# Patient Record
Sex: Female | Born: 1950
Health system: Southern US, Community
[De-identification: ages and names within clinical notes are randomized; demographics above are authoritative.]

## PROBLEM LIST (undated history)

## (undated) DIAGNOSIS — I10 Essential (primary) hypertension: Secondary | ICD-10-CM

## (undated) HISTORY — PX: ABDOMINAL HYSTERECTOMY: SHX81

---

## 1998-03-30 ENCOUNTER — Encounter: Admission: RE | Admit: 1998-03-30 | Discharge: 1998-03-30 | Payer: Self-pay | Admitting: Family Medicine

## 1998-04-04 ENCOUNTER — Encounter: Admission: RE | Admit: 1998-04-04 | Discharge: 1998-04-04 | Payer: Self-pay | Admitting: Family Medicine

## 1998-04-05 ENCOUNTER — Encounter: Admission: RE | Admit: 1998-04-05 | Discharge: 1998-04-05 | Payer: Self-pay | Admitting: Family Medicine

## 1998-04-06 ENCOUNTER — Encounter: Admission: RE | Admit: 1998-04-06 | Discharge: 1998-04-06 | Payer: Self-pay | Admitting: Family Medicine

## 1998-04-27 ENCOUNTER — Encounter: Admission: RE | Admit: 1998-04-27 | Discharge: 1998-04-27 | Payer: Self-pay | Admitting: Family Medicine

## 1998-05-05 ENCOUNTER — Encounter: Admission: RE | Admit: 1998-05-05 | Discharge: 1998-05-05 | Payer: Self-pay | Admitting: Family Medicine

## 1998-05-13 ENCOUNTER — Encounter: Admission: RE | Admit: 1998-05-13 | Discharge: 1998-05-13 | Payer: Self-pay | Admitting: Family Medicine

## 1999-01-02 ENCOUNTER — Encounter: Admission: RE | Admit: 1999-01-02 | Discharge: 1999-01-02 | Payer: Self-pay | Admitting: Family Medicine

## 1999-01-09 ENCOUNTER — Encounter: Admission: RE | Admit: 1999-01-09 | Discharge: 1999-01-09 | Payer: Self-pay | Admitting: Family Medicine

## 1999-01-24 ENCOUNTER — Encounter: Admission: RE | Admit: 1999-01-24 | Discharge: 1999-01-24 | Payer: Self-pay | Admitting: Family Medicine

## 1999-02-01 ENCOUNTER — Encounter: Admission: RE | Admit: 1999-02-01 | Discharge: 1999-02-01 | Payer: Self-pay | Admitting: Family Medicine

## 1999-02-22 ENCOUNTER — Encounter: Admission: RE | Admit: 1999-02-22 | Discharge: 1999-02-22 | Payer: Self-pay | Admitting: Family Medicine

## 1999-04-16 ENCOUNTER — Emergency Department (HOSPITAL_COMMUNITY): Admission: EM | Admit: 1999-04-16 | Discharge: 1999-04-16 | Payer: Self-pay | Admitting: *Deleted

## 1999-08-07 ENCOUNTER — Encounter: Admission: RE | Admit: 1999-08-07 | Discharge: 1999-08-07 | Payer: Self-pay | Admitting: Family Medicine

## 1999-09-20 ENCOUNTER — Encounter: Admission: RE | Admit: 1999-09-20 | Discharge: 1999-09-20 | Payer: Self-pay | Admitting: Family Medicine

## 2000-01-24 ENCOUNTER — Encounter: Admission: RE | Admit: 2000-01-24 | Discharge: 2000-01-24 | Payer: Self-pay | Admitting: Family Medicine

## 2000-02-01 ENCOUNTER — Encounter: Admission: RE | Admit: 2000-02-01 | Discharge: 2000-02-01 | Payer: Self-pay | Admitting: Family Medicine

## 2000-03-26 ENCOUNTER — Encounter: Admission: RE | Admit: 2000-03-26 | Discharge: 2000-03-26 | Payer: Self-pay | Admitting: Sports Medicine

## 2000-09-24 ENCOUNTER — Encounter: Admission: RE | Admit: 2000-09-24 | Discharge: 2000-09-24 | Payer: Self-pay | Admitting: Family Medicine

## 2001-03-10 ENCOUNTER — Emergency Department (HOSPITAL_COMMUNITY): Admission: EM | Admit: 2001-03-10 | Discharge: 2001-03-10 | Payer: Self-pay | Admitting: Emergency Medicine

## 2001-03-10 ENCOUNTER — Encounter: Payer: Self-pay | Admitting: Emergency Medicine

## 2001-04-04 ENCOUNTER — Encounter: Admission: RE | Admit: 2001-04-04 | Discharge: 2001-04-04 | Payer: Self-pay | Admitting: Family Medicine

## 2001-05-27 ENCOUNTER — Ambulatory Visit (HOSPITAL_COMMUNITY): Admission: RE | Admit: 2001-05-27 | Discharge: 2001-05-27 | Payer: Self-pay | Admitting: Sports Medicine

## 2001-09-12 ENCOUNTER — Encounter: Payer: Self-pay | Admitting: Sports Medicine

## 2001-09-12 ENCOUNTER — Encounter: Admission: RE | Admit: 2001-09-12 | Discharge: 2001-09-12 | Payer: Self-pay | Admitting: Sports Medicine

## 2001-12-03 ENCOUNTER — Encounter: Admission: RE | Admit: 2001-12-03 | Discharge: 2001-12-03 | Payer: Self-pay | Admitting: Family Medicine

## 2002-09-18 ENCOUNTER — Encounter: Admission: RE | Admit: 2002-09-18 | Discharge: 2002-09-18 | Payer: Self-pay | Admitting: *Deleted

## 2002-09-18 ENCOUNTER — Encounter: Payer: Self-pay | Admitting: Sports Medicine

## 2002-12-24 ENCOUNTER — Encounter: Payer: Self-pay | Admitting: Emergency Medicine

## 2002-12-24 ENCOUNTER — Inpatient Hospital Stay (HOSPITAL_COMMUNITY): Admission: EM | Admit: 2002-12-24 | Discharge: 2002-12-26 | Payer: Self-pay | Admitting: Emergency Medicine

## 2002-12-25 ENCOUNTER — Encounter: Payer: Self-pay | Admitting: Internal Medicine

## 2003-01-12 ENCOUNTER — Encounter: Admission: RE | Admit: 2003-01-12 | Discharge: 2003-01-12 | Payer: Self-pay | Admitting: Family Medicine

## 2003-02-04 ENCOUNTER — Ambulatory Visit (HOSPITAL_COMMUNITY): Admission: RE | Admit: 2003-02-04 | Discharge: 2003-02-04 | Payer: Self-pay | Admitting: Interventional Cardiology

## 2003-02-15 ENCOUNTER — Encounter: Admission: RE | Admit: 2003-02-15 | Discharge: 2003-02-15 | Payer: Self-pay | Admitting: Family Medicine

## 2003-02-26 ENCOUNTER — Encounter: Admission: RE | Admit: 2003-02-26 | Discharge: 2003-02-26 | Payer: Self-pay | Admitting: Family Medicine

## 2003-02-26 ENCOUNTER — Ambulatory Visit: Admission: RE | Admit: 2003-02-26 | Discharge: 2003-02-26 | Payer: Self-pay | Admitting: Family Medicine

## 2003-03-24 ENCOUNTER — Encounter: Admission: RE | Admit: 2003-03-24 | Discharge: 2003-03-24 | Payer: Self-pay | Admitting: Family Medicine

## 2003-04-07 ENCOUNTER — Encounter: Admission: RE | Admit: 2003-04-07 | Discharge: 2003-04-07 | Payer: Self-pay | Admitting: Family Medicine

## 2003-06-17 ENCOUNTER — Encounter: Admission: RE | Admit: 2003-06-17 | Discharge: 2003-06-17 | Payer: Self-pay | Admitting: Family Medicine

## 2003-10-15 ENCOUNTER — Encounter: Payer: Self-pay | Admitting: Sports Medicine

## 2003-10-15 ENCOUNTER — Encounter: Admission: RE | Admit: 2003-10-15 | Discharge: 2003-10-15 | Payer: Self-pay | Admitting: Sports Medicine

## 2004-01-03 ENCOUNTER — Encounter: Admission: RE | Admit: 2004-01-03 | Discharge: 2004-01-03 | Payer: Self-pay | Admitting: Family Medicine

## 2004-07-26 ENCOUNTER — Encounter: Admission: RE | Admit: 2004-07-26 | Discharge: 2004-07-26 | Payer: Self-pay | Admitting: Family Medicine

## 2004-07-30 ENCOUNTER — Emergency Department (HOSPITAL_COMMUNITY): Admission: EM | Admit: 2004-07-30 | Discharge: 2004-07-30 | Payer: Self-pay | Admitting: Emergency Medicine

## 2005-05-30 ENCOUNTER — Ambulatory Visit: Payer: Self-pay | Admitting: Family Medicine

## 2005-06-18 ENCOUNTER — Encounter: Admission: RE | Admit: 2005-06-18 | Discharge: 2005-06-18 | Payer: Self-pay | Admitting: Sports Medicine

## 2005-06-23 ENCOUNTER — Encounter (INDEPENDENT_AMBULATORY_CARE_PROVIDER_SITE_OTHER): Payer: Self-pay | Admitting: *Deleted

## 2005-07-12 ENCOUNTER — Ambulatory Visit: Payer: Self-pay | Admitting: Family Medicine

## 2006-01-02 ENCOUNTER — Ambulatory Visit: Payer: Self-pay | Admitting: Family Medicine

## 2006-01-09 ENCOUNTER — Ambulatory Visit: Payer: Self-pay | Admitting: Family Medicine

## 2006-02-13 ENCOUNTER — Emergency Department (HOSPITAL_COMMUNITY): Admission: EM | Admit: 2006-02-13 | Discharge: 2006-02-13 | Payer: Self-pay | Admitting: Emergency Medicine

## 2006-03-07 ENCOUNTER — Ambulatory Visit: Payer: Self-pay | Admitting: Family Medicine

## 2006-05-29 ENCOUNTER — Ambulatory Visit: Payer: Self-pay | Admitting: Family Medicine

## 2006-06-06 ENCOUNTER — Ambulatory Visit: Payer: Self-pay | Admitting: Family Medicine

## 2006-06-13 ENCOUNTER — Ambulatory Visit: Payer: Self-pay | Admitting: Family Medicine

## 2006-07-26 ENCOUNTER — Encounter: Admission: RE | Admit: 2006-07-26 | Discharge: 2006-07-26 | Payer: Self-pay | Admitting: Sports Medicine

## 2007-01-23 ENCOUNTER — Ambulatory Visit: Payer: Self-pay | Admitting: Family Medicine

## 2007-01-29 ENCOUNTER — Ambulatory Visit: Payer: Self-pay | Admitting: Family Medicine

## 2007-01-29 ENCOUNTER — Encounter (INDEPENDENT_AMBULATORY_CARE_PROVIDER_SITE_OTHER): Payer: Self-pay | Admitting: Family Medicine

## 2007-01-29 LAB — CONVERTED CEMR LAB
BUN: 15 mg/dL (ref 6–23)
CO2: 23 meq/L (ref 19–32)
Calcium: 9 mg/dL (ref 8.4–10.5)
Chloride: 108 meq/L (ref 96–112)
Cholesterol: 197 mg/dL (ref 0–200)
Creatinine, Ser: 0.68 mg/dL (ref 0.40–1.20)
Glucose, Bld: 91 mg/dL (ref 70–99)
HDL: 37 mg/dL — ABNORMAL LOW (ref >39)
Total CHOL/HDL Ratio: 5.3
Triglycerides: 50 mg/dL (ref <150)

## 2007-02-20 DIAGNOSIS — R079 Chest pain, unspecified: Secondary | ICD-10-CM | POA: Insufficient documentation

## 2007-02-20 DIAGNOSIS — I10 Essential (primary) hypertension: Secondary | ICD-10-CM | POA: Insufficient documentation

## 2007-02-20 DIAGNOSIS — H269 Unspecified cataract: Secondary | ICD-10-CM | POA: Insufficient documentation

## 2007-02-20 DIAGNOSIS — E876 Hypokalemia: Secondary | ICD-10-CM | POA: Insufficient documentation

## 2007-02-20 DIAGNOSIS — E7800 Pure hypercholesterolemia, unspecified: Secondary | ICD-10-CM | POA: Insufficient documentation

## 2007-02-20 DIAGNOSIS — E782 Mixed hyperlipidemia: Secondary | ICD-10-CM | POA: Insufficient documentation

## 2007-02-20 DIAGNOSIS — R12 Heartburn: Secondary | ICD-10-CM | POA: Insufficient documentation

## 2007-02-21 ENCOUNTER — Encounter (INDEPENDENT_AMBULATORY_CARE_PROVIDER_SITE_OTHER): Payer: Self-pay | Admitting: *Deleted

## 2007-08-29 ENCOUNTER — Encounter: Admission: RE | Admit: 2007-08-29 | Discharge: 2007-08-29 | Payer: Self-pay | Admitting: Sports Medicine

## 2007-09-01 ENCOUNTER — Encounter (INDEPENDENT_AMBULATORY_CARE_PROVIDER_SITE_OTHER): Payer: Self-pay | Admitting: Family Medicine

## 2007-11-28 ENCOUNTER — Telehealth (INDEPENDENT_AMBULATORY_CARE_PROVIDER_SITE_OTHER): Payer: Self-pay | Admitting: Family Medicine

## 2008-01-03 ENCOUNTER — Emergency Department (HOSPITAL_COMMUNITY): Admission: EM | Admit: 2008-01-03 | Discharge: 2008-01-03 | Payer: Self-pay | Admitting: Emergency Medicine

## 2008-01-14 ENCOUNTER — Telehealth: Payer: Self-pay | Admitting: *Deleted

## 2008-02-10 ENCOUNTER — Encounter: Payer: Self-pay | Admitting: *Deleted

## 2008-03-15 ENCOUNTER — Emergency Department (HOSPITAL_COMMUNITY): Admission: EM | Admit: 2008-03-15 | Discharge: 2008-03-15 | Payer: Self-pay | Admitting: Emergency Medicine

## 2008-03-15 ENCOUNTER — Telehealth: Payer: Self-pay | Admitting: *Deleted

## 2008-03-15 ENCOUNTER — Telehealth (INDEPENDENT_AMBULATORY_CARE_PROVIDER_SITE_OTHER): Payer: Self-pay | Admitting: *Deleted

## 2008-03-16 ENCOUNTER — Encounter: Payer: Self-pay | Admitting: Family Medicine

## 2008-03-16 ENCOUNTER — Ambulatory Visit: Payer: Self-pay | Admitting: Family Medicine

## 2008-03-16 DIAGNOSIS — J069 Acute upper respiratory infection, unspecified: Secondary | ICD-10-CM | POA: Insufficient documentation

## 2008-03-17 LAB — CONVERTED CEMR LAB
Cholesterol: 172 mg/dL (ref 0–200)
LDL Cholesterol: 121 mg/dL — ABNORMAL HIGH (ref 0–99)
Total CHOL/HDL Ratio: 7.5
VLDL: 28 mg/dL (ref 0–40)

## 2008-06-15 ENCOUNTER — Telehealth (INDEPENDENT_AMBULATORY_CARE_PROVIDER_SITE_OTHER): Payer: Self-pay | Admitting: *Deleted

## 2008-07-07 ENCOUNTER — Encounter: Payer: Self-pay | Admitting: *Deleted

## 2009-05-27 ENCOUNTER — Ambulatory Visit: Payer: Self-pay | Admitting: Family Medicine

## 2009-05-27 ENCOUNTER — Ambulatory Visit (HOSPITAL_COMMUNITY): Admission: RE | Admit: 2009-05-27 | Discharge: 2009-05-27 | Payer: Self-pay | Admitting: Family Medicine

## 2009-05-27 ENCOUNTER — Observation Stay (HOSPITAL_COMMUNITY): Admission: AD | Admit: 2009-05-27 | Discharge: 2009-05-28 | Payer: Self-pay | Admitting: Family Medicine

## 2009-05-27 DIAGNOSIS — I119 Hypertensive heart disease without heart failure: Secondary | ICD-10-CM | POA: Insufficient documentation

## 2009-05-27 DIAGNOSIS — I10 Essential (primary) hypertension: Secondary | ICD-10-CM | POA: Insufficient documentation

## 2009-05-30 ENCOUNTER — Ambulatory Visit: Payer: Self-pay | Admitting: Family Medicine

## 2009-05-30 ENCOUNTER — Encounter (INDEPENDENT_AMBULATORY_CARE_PROVIDER_SITE_OTHER): Payer: Self-pay | Admitting: Family Medicine

## 2009-05-30 DIAGNOSIS — I517 Cardiomegaly: Secondary | ICD-10-CM | POA: Insufficient documentation

## 2009-05-30 LAB — CONVERTED CEMR LAB
BUN: 26 mg/dL — ABNORMAL HIGH (ref 6–23)
Potassium: 3.6 meq/L (ref 3.5–5.3)
Sodium: 141 meq/L (ref 135–145)

## 2009-05-31 ENCOUNTER — Inpatient Hospital Stay (HOSPITAL_COMMUNITY): Admission: EM | Admit: 2009-05-31 | Discharge: 2009-06-02 | Payer: Self-pay | Admitting: Emergency Medicine

## 2009-05-31 ENCOUNTER — Encounter: Payer: Self-pay | Admitting: Family Medicine

## 2009-05-31 DIAGNOSIS — R42 Dizziness and giddiness: Secondary | ICD-10-CM | POA: Insufficient documentation

## 2009-06-01 ENCOUNTER — Encounter (INDEPENDENT_AMBULATORY_CARE_PROVIDER_SITE_OTHER): Payer: Self-pay | Admitting: Family Medicine

## 2009-06-16 ENCOUNTER — Ambulatory Visit: Payer: Self-pay | Admitting: Family Medicine

## 2009-06-16 ENCOUNTER — Encounter (INDEPENDENT_AMBULATORY_CARE_PROVIDER_SITE_OTHER): Payer: Self-pay | Admitting: Family Medicine

## 2009-06-17 ENCOUNTER — Encounter (INDEPENDENT_AMBULATORY_CARE_PROVIDER_SITE_OTHER): Payer: Self-pay | Admitting: Family Medicine

## 2009-06-17 LAB — CONVERTED CEMR LAB
Glucose, Bld: 98 mg/dL (ref 70–99)
Potassium: 3.3 meq/L — ABNORMAL LOW (ref 3.5–5.3)
Sodium: 140 meq/L (ref 135–145)

## 2009-06-24 ENCOUNTER — Encounter: Payer: Self-pay | Admitting: Family Medicine

## 2009-09-19 ENCOUNTER — Telehealth: Payer: Self-pay | Admitting: *Deleted

## 2010-10-03 ENCOUNTER — Telehealth: Payer: Self-pay | Admitting: Family Medicine

## 2010-10-04 ENCOUNTER — Ambulatory Visit: Payer: Self-pay | Admitting: Family Medicine

## 2010-10-05 ENCOUNTER — Encounter: Payer: Self-pay | Admitting: Family Medicine

## 2010-10-05 LAB — CONVERTED CEMR LAB
CO2: 27 meq/L (ref 19–32)
Calcium: 9.3 mg/dL (ref 8.4–10.5)
Glucose, Bld: 95 mg/dL (ref 70–99)
Sodium: 143 meq/L (ref 135–145)

## 2010-10-24 ENCOUNTER — Encounter: Payer: Self-pay | Admitting: Family Medicine

## 2010-10-24 ENCOUNTER — Ambulatory Visit: Payer: Self-pay | Admitting: Family Medicine

## 2010-10-24 LAB — CONVERTED CEMR LAB
AST: 17 units/L (ref 0–37)
Bilirubin, Direct: 0.1 mg/dL (ref 0.0–0.3)
Total Bilirubin: 0.7 mg/dL (ref 0.3–1.2)
Total CHOL/HDL Ratio: 4.9
VLDL: 10 mg/dL (ref 0–40)

## 2010-10-26 ENCOUNTER — Telehealth: Payer: Self-pay | Admitting: *Deleted

## 2010-10-31 ENCOUNTER — Telehealth: Payer: Self-pay | Admitting: *Deleted

## 2010-10-31 ENCOUNTER — Ambulatory Visit: Payer: Self-pay | Admitting: Family Medicine

## 2011-01-23 NOTE — Progress Notes (Signed)
Summary: informing pt of bp  Phone Note Outgoing Call   Call placed by: Jimmy Footman, CMA,  October 31, 2010 2:24 PM Call placed to: Insurer Summary of Call: LVM for pt to call back. Dr. Clotilde Dieter called and was unable to reach pt. Note is in chart concerning BP  Follow-up for Phone Call        called and lvm for pt to return call Follow-up by: Loralee Pacas CMA,  November 01, 2010 9:51 AM  Additional Follow-up for Phone Call Additional follow up Details #1::        LVM for pt to call back Additional Follow-up by: Jimmy Footman, CMA,  November 02, 2010 10:23 AM    Additional Follow-up for Phone Call Additional follow up Details #2::    called and lvm for pt  Follow-up by: Loralee Pacas CMA,  November 02, 2010 4:40 PM  Additional Follow-up for Phone Call Additional follow up Details #3:: Details for Additional Follow-up Action Taken: called and left vm for pt to call back Additional Follow-up by: Jimmy Footman, CMA,  November 03, 2010 2:31 PM   Lvm for patient to call back.Jimmy Footman, CMA  November 06, 2010 11:39 AM   Spoke with pat.Jimmy Footman, CMA  November 06, 2010 4:10 PM

## 2011-01-23 NOTE — Progress Notes (Signed)
Summary: refill  Phone Note Refill Request Call back at Home Phone [phone] Message from:  Patient  Refills Requested: Medication #1:  LISINOPRIL-HYDROCHLOROTHIAZIDE 20-12.5 MG TABS 1 by mouth daily has appt on 11/1 - needs enough until appt  Initial call taken by: De Nurse,  October 03, 2010 8:33 AM  Follow-up for Phone Call        Pt needs to have potassium checked before we can refill this medicine again.  Could she come in for a lab visit (does not need to be fasting - order is already in the computer) and then we can refill the medicine?   Follow-up by: Alliya Marcon [place] MD,  October 03, 2010 9:35 AM  Additional Follow-up for Phone Call Additional follow up Details #1::        LVM for pt to come to labs Additional Follow-up by: De Nurse,  October 04, 2010 9:27 AM    Additional Follow-up for Phone Call Additional follow up Details #2::    Pt returned call and is coming in today for labs. Follow-up by: Clydell Hakim,  October 04, 2010 12:06 PM

## 2011-01-23 NOTE — Assessment & Plan Note (Signed)
Summary: bp check/eo  Nurse Visit  Patient in for BP check today. BP checked manually after resting 10 minutes with regular adult cuff. BP LA 160/92 RA 150/ 94. pulse 68. patient reports she is taking meds as directed.  states she feels well. will forward to MD. Theresia Lo RN  October 31, 2010 11:44 AM   That BP is much better than her 200's/100's that she had during her last appointment. It is still not ideal though. I tried to call the patient and ask her to go up on the Norvasc to hopefully get just a little better control, but I got a weird noise on her answering machine. I will have white team continue to try to get a hold of her. I will send in a new Rx with the higher dose but for now she can just take 2 of the Norvasc until they are gone. Thanks. Jamie Brookes MD  October 31, 2010 12:07 PM   Allergies: No Known Drug Allergies  Orders Added: 1)  No Charge Patient Arrived (NCPA0) [NCPA0] Prescriptions: NORVASC 10 MG TABS (AMLODIPINE BESYLATE) take 1 pill daily for high BP  #30 x 3   Entered and Authorized by:   Jamie Brookes MD   Signed by:   Jamie Brookes MD on 10/31/2010   Method used:   [facility]        [facility] [address] [phone]* (retail)       [address]       [place], [place]  [address]       Ph: [phone] or [phone]       Fax: [phone]   RxID:   [id number]   Appended Document: bp check/eo patient left her home number and her work number . work # [phone] and home [phone]. I also tried calling and home phone is not working . left message on work phone number to call back.  Appended Document: bp check/eo patient notified advised to recheck BP in 2 weeks.

## 2011-01-23 NOTE — Assessment & Plan Note (Signed)
Summary: HTN, HLD, feet/ankle hurt   Vital Signs:  Patient profile:   60 year old female Weight:      177.3 pounds Temp:     98.2 degrees F oral Pulse rate:   63 / minute Pulse rhythm:   regular BP sitting:   211 / 100  (right arm) Cuff size:   regular  Vitals Entered By: Loralee Pacas CMA (October 24, 2010 9:04 AM) CC: HTN meds refilled Is Patient Diabetic? No   Primary Care Provider:  . [facility]  CC:  HTN meds refilled.  History of Present Illness: hypertension: Pt comes in today with very elevated BP (200/100). She has been out of some of her meds, she says she is usually in the 160's/90's. She has been out of the Norvasc for a while but she just ran out of her other BP meds. She sees Dr. Garnette Scheuermann [facility]) and says she is going to see him soon about the weakness and tiredness she feels shen walking up the hill. She is not having any chest pain. She had a cardiac cath in the past that was normal (told she has no blockages). No blurry vision or other side effects complained of today. '  HLd:  Pt has also run out of simvastatin and is suppose to be on this med.   Foot and Ankle pain when walking. no injury, wants to know if she should get new shoes.   Habits & Providers  Alcohol-Tobacco-Diet     Tobacco Status: quit  Exercise-Depression-Behavior     Does Patient Exercise: yes     Exercise Counseling: to improve exercise regimen     Type of exercise: walking     Exercise (avg: min/session): <30     Times/week: 5     Have you felt down or hopeless? no     Have you felt little pleasure in things? no     Depression Counseling: not indicated; screening negative for depression     Seat Belt Use: always  Current Medications (verified): 1)  Aspirin Ec 81 Mg Tbec (Aspirin) .... Take 1 Tablet By Mouth Once A Day 2)  Lisinopril-Hydrochlorothiazide 20-12.5 Mg Tabs (Lisinopril-Hydrochlorothiazide) .Marland Kitchen.. 1 By Mouth Daily 3)  Norvasc 5 Mg Tabs (Amlodipine Besylate) .Marland Kitchen..  1 By Mouth Daily 4)  Simvastatin 40 Mg Tabs (Simvastatin) .... Take On Tablet Qhs 5)  Nitroglycerin 0.4 Mg Subl (Nitroglycerin) .Marland Kitchen.. 1 Under Tongue Every 5 Min X 3 Doses As Needed For Chest Pain, Seek Help If You Need The Third Dose 6)  Potassium Chloride Crys Cr 20 Meq Cr-Tabs (Potassium Chloride Crys Cr) .Marland Kitchen.. 1 By Mouth Daily X 3 Days  Allergies (verified): No Known Drug Allergies  Social History: Does Patient Exercise:  yes Seat Belt Use:  always  Review of Systems        vitals reviewed and pertinent negatives and positives seen in HPI   Physical Exam  General:  Well-developed,well-nourished,in no acute distress; alert,appropriate and cooperative throughout examination Lungs:  Normal respiratory effort, chest expands symmetrically. Lungs are clear to auscultation, no crackles or wheezes. Heart:  Normal rate and regular rhythm. S1 and S2 normal without gallop, murmur, click, rub or other extra sounds. Extremities:  No clubbing, cyanosis, edema, or deformity noted with normal full range of motion of all joints.     Impression & Recommendations:  Problem # 1:  HYPERTENSION, BENIGN SYSTEMIC (ICD-401.1) Assessment Deteriorated Pt had her BP meds refilled today. RTC in 1 week for  RN BP check.   Her updated medication list for this problem includes:    Lisinopril-hydrochlorothiazide 20-12.5 Mg Tabs (Lisinopril-hydrochlorothiazide) .Marland Kitchen... 1 by mouth daily    Norvasc 5 Mg Tabs (Amlodipine besylate) .Marland Kitchen... 1 by mouth daily  Orders: [facility]- Est Level  3 (69629)  Problem # 2:  HYPERCHOLESTEROLEMIA (ICD-272.0) Assessment: Unchanged Pt restarted on Simvastatin. Labs checked today.   Her updated medication list for this problem includes:    Simvastatin 40 Mg Tabs (Simvastatin) .Marland Kitchen... Take on tablet qhs  Orders: Hepatic-[facility] ([address]41-[address]40) Lipid-[facility] ([address]64) [facility]- Est Level  3 ([address]) Pt concerned about getting new shoes, suggested getting new shoes and putting good supportive  inserts in them. See insturctions  Complete Medication List: 1)  Aspirin Ec 81 Mg Tbec (Aspirin) .... Take 1 tablet by mouth once a day 2)  Lisinopril-hydrochlorothiazide 20-12.5 Mg Tabs (Lisinopril-hydrochlorothiazide) .Marland Kitchen.. 1 by mouth daily 3)  Norvasc 5 Mg Tabs (Amlodipine besylate) .Marland Kitchen.. 1 by mouth daily 4)  Simvastatin 40 Mg Tabs (Simvastatin) .... Take on tablet qhs 5)  Nitroglycerin 0.4 Mg Subl (Nitroglycerin) .Marland Kitchen.. 1 under tongue every 5 min x 3 doses as needed for chest pain, seek help if you need the third dose 6)  Potassium Chloride Crys Cr 20 Meq Cr-tabs (Potassium chloride crys cr) .Marland Kitchen.. 1 by mouth daily x 3 days  Patient Instructions: 1)  Good job with your exercising.  2)  If you get new shoes, consider getting Super Feet inserts (find them at [facility] in [facility] near [place]).  3)  We are getting liver function tests and fasting lipid tests.  4)  We refilled your BP meds.  5)  COme in next week, for a RN visit, just to check BP to make sure it's going down.  Prescriptions: POTASSIUM CHLORIDE CRYS CR 20 MEQ CR-TABS (POTASSIUM CHLORIDE CRYS CR) 1 by mouth daily x 3 days  #31 x 5   Entered by:   Loralee Pacas CMA   Authorized by:   Jamie Brookes MD   Signed by:   Loralee Pacas CMA on 10/24/2010   Method used:   [facility]        [facility] [address] [phone]* (retail)       [address]       [place], [place]  [address]       Ph: [phone] or [phone]       Fax: [phone]   RxID:   [id number] SIMVASTATIN 40 MG TABS (SIMVASTATIN) Take on tablet QHS  #31 x 5   Entered by:   Loralee Pacas CMA   Authorized by:   Jamie Brookes MD   Signed by:   Loralee Pacas CMA on 10/24/2010   Method used:   Electronically to        [facility] [address] [phone]* (retail)       [address]       [place], [place]  [address]       Ph: [phone] or [phone]       Fax: [phone]   RxID:    [id number] NORVASC 5 MG TABS (AMLODIPINE BESYLATE) 1 by mouth daily  #31 x 5   Entered by:   Loralee Pacas CMA   Authorized by:   Jamie Brookes MD   Signed by:   Loralee Pacas CMA on 10/24/2010   Method used:   Electronically to  [facility] [address] [phone]* (retail)       [address]       [place], [place]  [address]       Ph: [phone] or [phone]       Fax: [phone]   RxID:   [id number] LISINOPRIL-HYDROCHLOROTHIAZIDE 20-12.5 MG TABS (LISINOPRIL-HYDROCHLOROTHIAZIDE) 1 by mouth daily  #31 x 5   Entered by:   Loralee Pacas CMA   Authorized by:   Jamie Brookes MD   Signed by:   Loralee Pacas CMA on 10/24/2010   Method used:   Electronically to        [facility] [address] [phone]* (retail)       [address]       [place], [place]  [address]       Ph: [phone] or [phone]       Fax: [phone]   RxID:   [id number]    Orders Added: 1)  Hepatic-[facility] [80076-22960] 2)  Lipid-[facility] [80061-22930] 3)  [facility]- Est Level  3 [[address]]

## 2011-01-23 NOTE — Progress Notes (Signed)
  Phone Note Call from Patient   Caller: Patient Call For: [phone] or [phone] Summary of Call: Pt was returning a call she received yesterday.  She is presently at work.  So if you want to call her back call numbers above and if you are unable to reach me lv msg at that latter . Initial call taken by: Abundio Miu,  October 26, 2010 9:42 AM

## 2011-02-28 ENCOUNTER — Emergency Department (HOSPITAL_COMMUNITY)
Admission: EM | Admit: 2011-02-28 | Discharge: 2011-02-28 | Disposition: A | Discharge status: Home / Self Care | Payer: BC Managed Care – PPO | Attending: Emergency Medicine | Admitting: Emergency Medicine

## 2011-02-28 ENCOUNTER — Inpatient Hospital Stay (INDEPENDENT_AMBULATORY_CARE_PROVIDER_SITE_OTHER)
Admission: RE | Admit: 2011-02-28 | Discharge: 2011-02-28 | Disposition: A | Discharge status: Home / Self Care | Payer: BC Managed Care – PPO | Source: Ambulatory Visit | Attending: Family Medicine | Admitting: Family Medicine

## 2011-02-28 DIAGNOSIS — I10 Essential (primary) hypertension: Secondary | ICD-10-CM | POA: Insufficient documentation

## 2011-02-28 DIAGNOSIS — R51 Headache: Secondary | ICD-10-CM | POA: Insufficient documentation

## 2011-02-28 DIAGNOSIS — R42 Dizziness and giddiness: Secondary | ICD-10-CM | POA: Insufficient documentation

## 2011-04-02 LAB — CBC
HCT: 38.8 % (ref 36.0–46.0)
HCT: 42.3 % (ref 36.0–46.0)
Hemoglobin: 14.5 g/dL (ref 12.0–15.0)
MCHC: 34.3 g/dL (ref 30.0–36.0)
MCV: 89.3 fL (ref 78.0–100.0)
MCV: 89.8 fL (ref 78.0–100.0)
Platelets: 199 10*3/uL (ref 150–400)
Platelets: 210 10*3/uL (ref 150–400)
RBC: 4.73 MIL/uL (ref 3.87–5.11)
RDW: 14.6 % (ref 11.5–15.5)
WBC: 5.3 10*3/uL (ref 4.0–10.5)

## 2011-04-02 LAB — CARDIAC PANEL(CRET KIN+CKTOT+MB+TROPI)
CK, MB: 1 ng/mL (ref 0.3–4.0)
CK, MB: 1.1 ng/mL (ref 0.3–4.0)
CK, MB: 1.5 ng/mL (ref 0.3–4.0)
CK, MB: 1.6 ng/mL (ref 0.3–4.0)
Relative Index: 1.4 (ref 0.0–2.5)
Relative Index: INVALID (ref 0.0–2.5)
Relative Index: INVALID (ref 0.0–2.5)
Total CK: 104 U/L (ref 7–177)
Total CK: 68 U/L (ref 7–177)
Total CK: 75 U/L (ref 7–177)
Troponin I: 0.02 ng/mL (ref 0.00–0.06)

## 2011-04-02 LAB — APTT: aPTT: 30 seconds (ref 24–37)

## 2011-04-02 LAB — PROTIME-INR
INR: 1.1 (ref 0.00–1.49)
Prothrombin Time: 14.5 s (ref 11.6–15.2)

## 2011-04-02 LAB — BASIC METABOLIC PANEL
BUN: 17 mg/dL (ref 6–23)
BUN: 20 mg/dL (ref 6–23)
BUN: 24 mg/dL — ABNORMAL HIGH (ref 6–23)
CO2: 29 mEq/L (ref 19–32)
CO2: 30 mEq/L (ref 19–32)
Chloride: 101 mEq/L (ref 96–112)
Chloride: 103 mEq/L (ref 96–112)
Chloride: 104 mEq/L (ref 96–112)
Chloride: 104 mEq/L (ref 96–112)
Chloride: 106 mEq/L (ref 96–112)
Creatinine, Ser: 0.76 mg/dL (ref 0.4–1.2)
GFR calc Af Amer: >60 mL/min (ref >60)
GFR calc Af Amer: >60 mL/min (ref >60)
GFR calc non Af Amer: >60 mL/min (ref >60)
GFR calc non Af Amer: >60 mL/min (ref >60)
GFR calc non Af Amer: >60 mL/min (ref >60)
Glucose, Bld: 109 mg/dL — ABNORMAL HIGH (ref 70–99)
Glucose, Bld: 123 mg/dL — ABNORMAL HIGH (ref 70–99)
Potassium: 3 mEq/L — ABNORMAL LOW (ref 3.5–5.1)
Potassium: 3.3 mEq/L — ABNORMAL LOW (ref 3.5–5.1)
Potassium: 3.3 mEq/L — ABNORMAL LOW (ref 3.5–5.1)
Potassium: 3.3 mEq/L — ABNORMAL LOW (ref 3.5–5.1)
Potassium: 3.8 mEq/L (ref 3.5–5.1)
Sodium: 139 mEq/L (ref 135–145)
Sodium: 139 mEq/L (ref 135–145)
Sodium: 140 mEq/L (ref 135–145)
Sodium: 140 mEq/L (ref 135–145)

## 2011-04-02 LAB — HEMOGLOBIN A1C
Hgb A1c MFr Bld: 5.2 % (ref 4.6–6.1)
Mean Plasma Glucose: 103 mg/dL

## 2011-04-02 LAB — LIPID PANEL
HDL: 37 mg/dL — ABNORMAL LOW
Total CHOL/HDL Ratio: 6.1 ratio
Triglycerides: 54 mg/dL
VLDL: 11 mg/dL (ref 0–40)

## 2011-04-02 LAB — URINALYSIS, ROUTINE W REFLEX MICROSCOPIC
Bilirubin Urine: NEGATIVE
Ketones, ur: NEGATIVE mg/dL
Nitrite: NEGATIVE
Urobilinogen, UA: 0.2 mg/dL (ref 0.0–1.0)

## 2011-04-02 LAB — DIFFERENTIAL
Basophils Absolute: 0 K/uL (ref 0.0–0.1)
Basophils Relative: 1 % (ref 0–1)
Eosinophils Absolute: 0.1 K/uL (ref 0.0–0.7)
Eosinophils Relative: 2 % (ref 0–5)
Lymphocytes Relative: 32 % (ref 12–46)
Lymphs Abs: 2.1 K/uL (ref 0.7–4.0)
Monocytes Absolute: 0.7 K/uL (ref 0.1–1.0)
Monocytes Relative: 10 % (ref 3–12)
Neutro Abs: 3.7 K/uL (ref 1.7–7.7)
Neutrophils Relative %: 56 % (ref 43–77)

## 2011-04-02 LAB — COMPREHENSIVE METABOLIC PANEL
ALT: 17 U/L (ref 0–35)
Chloride: 108 mEq/L (ref 96–112)
GFR calc Af Amer: >60 mL/min (ref >60)
GFR calc non Af Amer: >60 mL/min (ref >60)
Potassium: 3.5 mEq/L (ref 3.5–5.1)

## 2011-04-02 LAB — CK TOTAL AND CKMB (NOT AT ARMC)
CK, MB: 1.7 ng/mL (ref 0.3–4.0)
Relative Index: INVALID (ref 0.0–2.5)

## 2011-04-02 LAB — TROPONIN I: Troponin I: 0.03 ng/mL (ref 0.00–0.06)

## 2011-04-02 LAB — POCT CARDIAC MARKERS: Troponin i, poc: <0.05 ng/mL (ref 0.00–0.09)

## 2011-05-08 NOTE — Consult Note (Signed)
[name], [name]            ACCOUNT NO.:  [id number]   MEDICAL RECORD NO.:  [id number]          PATIENT TYPE:  INP   LOCATION:  6522                         FACILITY:  [facility]   PHYSICIAN:  Lyn Records, M.D.   DATE OF BIRTH:  01-25-1951   DATE OF CONSULTATION:  06/01/2009  DATE OF DISCHARGE:  06/02/2009                                 CONSULTATION   REASON FOR CONSULTATION:  Chest discomfort.   CONCLUSIONS:  1. Atypical chest pain that is unlikely to represent ischemic pain.      This does not represent an acute coronary syndrome.  2. Poorly controlled hypertension which is likely the source of the      patient's exertional dyspnea and chest tightness.  3. Hyperlipidemia.  4. Moderate obesity.  5. Recent vertigo of uncertain cause.   RECOMMENDATIONS:  1. Agree with aggressive blood pressure control.  2. Orthostasis is likely secondary to over diuresis.  Would recommend      decreasing hydrochlorothiazide to 12.5 mg per day and increasing      angiotensin-converting enzyme inhibitor therapy, perhaps using a      combination tablets such as lisinopril HCT 20/12.5 mg per day.  3. An outpatient nuclear myocardial perfusion study with exercise      stress should be done after the blood pressure is controlled.  The      study will help to reassess LV function and to document the absence      of myocardial ischemia.  It will also give [place] some idea of her      functional capacity.  4. I believe she can be discharged and this be safely done as an      outpatient.   COMMENTS:  The patient is a pleasant 60 year old African American female  with a history of hypertension, hyperlipidemia, and premature  atherosclerosis in her family history.  She underwent an extensive  cardiac evaluation in 2004 which included a coronary arteriogram that  was negative for evidence of CAD, although she did have minimal luminal  irregularities in the right coronary.  LV function and LV  end-diastolic  pressures at that time were normal.   Recently, the patient has been under emotional stress.  Several  relatives have died.  Last week, she was admitted from the [facility] because of extremely elevated blood pressures in the  greater than 200/110 range.  Her medications were titrated upward during  the hospital stay, and she was discharged home after an overnight  admission.  On the day of admission on this occasion, the concern was  the sudden development of dizziness that was characterized with  qualities of vertigo, chest tightness, and weakness upon standing.  Upon  admission to the hospital, there was a 40-point drop in blood pressure  when going from lying to standing.  This blood pressure change was  157/70 to 120/78.   Since admission to the hospital, cardiac markers are negative for  ischemia.  EKG reveals inferior Q-waves inversions.  EKG changes are  felt to be compatible with probable left ventricular hypertrophy.   The patient's  current medical regimen is:  1. Aspirin 81 mg per day.  2. K-Dur 20 mEq per day.  3. Norvasc 10 mg per day.  4. Prinivil 5 mg per day.  5. Zocor 40 mg per day.  Currently, hydrochlorothiazide is on hold.   SIGNIFICANT MEDICAL PROBLEMS:  Outlined above.   The patient denies tobacco use and does not drink alcohol.   PHYSICAL EXAMINATION:  GENERAL:  The patient is lying comfortably in  bed.  VITAL SIGNS:  She has a blood pressure of 130/60, heart rate is 60, and  temperature is 98.1.  NECK:  No JVD is noted.  No carotid bruits are heard.  LUNGS:  Clear.  CARDIAC:  Reveals an S4.  ABDOMEN:  Soft.  EXTREMITIES:  No edema.   LABORATORY DATA:  Negative markers for ischemia x3.  The BNP level is  less than 30.  The glycosylated hemoglobin is 5.2.  BUN and creatinine  are 28 and 0.91 on admission.  Potassium was 3.0.  Hemoglobin is 12.8.   The electrocardiograms revealed prominent voltage and T-wave abnormality   that I believe is secondary to LVH.  The chest x-ray on admission  reveals cardiomegaly, but otherwise unremarkable.   DISCUSSION:  I believe the patient likely has hypertensive heart disease  and poor control of her blood pressures.  I would suspect that she has  diastolic dysfunction.  The diuretic dose of 25 mg of HCTZ may have been  somewhat aggressive for her.  She will need to have a diuretic in her  regimen, but I will recommend only 12.5 mg per day and include this in a  combination tablet with an ACE inhibitor such as lisinopril HCT 20/12.5  mg per day.  She will need to have an outpatient myocardial perfusion  study with stress to assess LV function, rule out ischemia, and evaluate  her blood pressure control.      Lyn Records, M.D.  Electronically Signed     HWS/MEDQ  D:  06/02/2009  T:  06/03/2009  Job:  [id number]   cc:   Dr. Elvera Lennox, MD  Ruthe Mannan, M.D.

## 2011-05-08 NOTE — Discharge Summary (Signed)
[name], DODDRIDGE            ACCOUNT NO.:  [id number]   MEDICAL RECORD NO.:  [id number]          PATIENT TYPE:  INP   LOCATION:  6522                         FACILITY:  [facility]   PHYSICIAN:  Leighton Roach McDiarmid, M.D.DATE OF BIRTH:  1951/02/19   DATE OF ADMISSION:  05/31/2009  DATE OF DISCHARGE:  06/02/2009                               DISCHARGE SUMMARY   PRIMARY CARE [name]:  Levander Campion, MD, at [facility].   DISCHARGE DIAGNOSES:  1. Chest pain.  2. Dizziness.  3. Hypokalemia, resolved.  4. Hypertension.  5. Hyperlipidemia.  6. Obesity.   DISCHARGE MEDICATIONS:  1. Aspirin 81 mg 1 tablet daily.  2. Norvasc 5 mg 1 tablet daily.  3. Lisinopril/hydrochlorothiazide 20/12.5 one tablet daily.  4. Simvastatin 40 mg 1 tablet each night.  5. Nitroglycerin 0.3 sublingual take 1 tablet when needed for chest      pain, may repeat every 5 minutes for a total of 3 doses.  If she      still continues to have chest pain, return to the ED.   DISCONTINUED MEDICATIONS:  Hydrochlorothiazide, K-Dur.   CONSULT:  Lyn Records, MD, Cardiology.   PROCEDURE:  Two-view chest x-ray on June 8 shows minimal cardiac  enlargement.  Tortuous aorta.  No acute abnormalities.   LABORATORY DATA:  1. Cardiac enzymes negative x3.  2. BNP less than 30.  3. Urinalysis; specific gravity 1.013, trace blood, negative ketones,      negative nitrite, moderate leukocytes.  Urine micro shows rare      squamous epithelial cells, 3-6 white blood cells, many bacteria.  4. Basic metabolic panel:  On admission, potassium was noted to be 3.0      with creatinine of 0.91 and glucose 129.  On discharge, glucose was      98, creatinine 0.88, and potassium had corrected to 3.8.   BRIEF HOSPITAL COURSE:  This is a 60 year old African American female  who presented to the ED after having an episode of chest tightness and  lightheadedness that had resolved by time of admission.  Please see  dictated  admission H&P for full details.  1. Chest pain.  The patient presented with atypical chest pain, but      given her significant risk factors and family history, cardiac      enzymes cycled and a repeat EKG was obtained.  Her cardiologist was      consulted for consideration of inpatient versus outpatient stress      testing.  Cardiac enzymes and EKG were normal.  The patient has a      history of bradycardia, but tolerated the low-dose beta-blocker      while she was being ruled out for MI.  This was discontinued on      discharge.  The patient will follow up with Dr. Katrinka Blazing for stress      testing and echo.  Of note, the patient's EKG at this admission and      previously had shown T-wave in leads I through III and V4 through      V6 and on  this admission had shown a T-wave in V1.  2. Dizziness.  The patient's dizziness was questionably orthostatic by      history versus bradycardia.  The patient had resolved by time of      admission.  She noted one additional episode of dizziness upon      walking to the bathroom first thing in the morning.  Orthostatics      did show the patient's blood pressure decreased upon standing.  The      patient was not symptomatic at this time.  On telemetry, the      patient's bradycardia down into the 40s and 50s were also      asymptomatic.  These episodes are sporadic and the patient's      history not consistent with any of obvious causes at this time.  3. Hypertension.  The patient's blood pressure was elevated on      presentation to the ED with a systolic blood pressure in the 180s,      which then decreased to systolic in the 160s on admission.  The      patient's blood pressures continued to be elevated in the 150s and      160s throughout hospitalization given considering her bradycardia      and hypokalemia, her cardiologist felt it would be best to change      her medical regimen to Norvasc 5 mg daily and      lisinopril/hydrochlorothiazide combo  20/25.  The patient was asked      to stop taking hydrochlorothiazide.  4. Hyperlipidemia.  The patient was continued on her and sent home on      her home dose of simvastatin 40 mg.  5. Hypokalemia.  The patient has been hypokalemic before likely      secondary to hydrochlorothiazide.  The patient was repleted with      potassium 3.8 on discharge.  Given that the patient's      hydrochlorothiazide dose is being decreased and with the addition      of an ACE inhibitor, the patient was asked to discontinue her      potassium supplement and to follow up with PCP for repeat potassium      check.   FOLLOWUP APPOINTMENTS:  The patient will follow up with Dr. Katrinka Blazing to  schedule an outpatient echo and stress testing.  The patient will  contact her for appointment.  The patient to follow up with Dr. Luz Brazen  at [facility] in 1-2 weeks.      Delbert Harness, MD  Electronically Signed      Leighton Roach McDiarmid, M.D.  Electronically Signed    KB/MEDQ  D:  06/03/2009  T:  06/04/2009  Job:  [id number]   cc:   Levander Campion, M.D.  Lyn Records, M.D.

## 2011-05-08 NOTE — H&P (Signed)
[name], [name]:  [id number]   MEDICAL RECORD NO.:  [id number]          PATIENT TYPE:  OBV   LOCATION:  6522                         FACILITY:  [facility]   PHYSICIAN:  Pearlean Brownie, M.D.DATE OF BIRTH:  08/28/1951   DATE OF ADMISSION:  05/31/2009  DATE OF DISCHARGE:                              HISTORY & PHYSICAL   PRIMARY CARE [name]:  Laurie Campion, MD at [facility].   HISTORY OF PRESENT ILLNESS:  This is a 60 year old African American  female who presented to the ED after having an episode of chest  tightness and lightheadedness.   She states that she woke up at 1 a.m. and upon walking to the bathroom  felt lightheaded and had to sit down.  She also felt like her heart was  in a panic and felt tight chested.  The episode lasted about 15  minutes and she states she felt much better after sitting down.  By the  time new episode arrived, this episode had resolved.  Not associated  with shortness of breath, diaphoresis, or nausea.  The patient states  she had no pain with this episodes.  Prior to going to bed, she had a  headache, but was otherwise in her usual state of health.   The patient was recently admitted to the hospital on May 27, 2009, from  clinic for hypertensive urgency and an EKG which showed T-waves in leads  I through III and V4 through V6.  She denies any chest pain at this  time.  Throughout this hospitalization, there were no further EKG  changes and her cardiac enzymes were normal.  Upon discharge, the  patient was scheduled for an outpatient echo on June 02, 2009.   PAST MEDICAL HISTORY:  1. Hypertension.  2. Hyperlipidemia.  3. Obesity.  4. History of hypokalemia.   MEDICATIONS:  1. Aspirin 81 mg.  2. Hydrochlorothiazide 25 mg daily.  3. Norvasc 10 mg daily.  4. Simvastatin 40 mg daily.   ALLERGIES:  No known drug allergies.   FAMILY HISTORY:  The patient has a brother who had severe  hypertension  and a recent hemorrhagic CVA.  This brother also has diabetes.  The  patient's sister had hypertension, CVA, and an MI and died at age 57.  Her mother had hypertension and an MI and died at age 67.  Her father  had hypertension.  She has another sister who had stomach and liver  cancer.  She has niece and nephews with diabetes.   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  She has a history of tobacco abuse, but quit 20  years ago.  The patient does not drink alcohol or use illicit drugs.   REVIEW OF SYSTEMS:  Review of systems was negative except for as stated  in the HPI.   PHYSICAL EXAMINATION:  VITAL SIGNS:  Temperature 97.8 degrees, pulse 70,  respirations 20 per minute, blood pressure 164/85, and O2 96% on room  air.  GENERAL:  Pleasant obese female who is alert  and oriented x3 in no acute  distress.  HEENT:  Head normocephalic and atraumatic.  NECK:  Supple with no JVD or bruits.  LUNGS:  Normal respiratory effort.  Lungs are clear to auscultation.  HEART:  Regular rate and rhythm, 2/6 systolic murmur.  ABDOMEN:  Bowel sounds positive, soft, nontender, no distention.  EXTREMITIES:  No clubbing, cyanosis, edema noted.   LABORATORY DATA:  1. CBC:  White blood count 6.7, hemoglobin 14.5, hematocrit 42.3,      platelets 208.  2. BMET:  Sodium 139, potassium 3, chloride 101, bicarbonate 29,      glucose 129, BUN 28, creatinine 0.91, calcium 9.7.  3. INR 1.1.  4. BNP less than 30.  5. Urinalysis shows specific gravity of 1.103, glucose negative,      hemoglobin trace, ketones negative, nitrites negative, leukocyte      esterase moderate.  Micro shows rare epis, 3-6 white blood cells,      many bacteria.  6. Point-of-care cardiac markers negative x1 in the ED.   EKG shows normal sinus rhythm at a rate of 82.  T-waves noted in V1.  No  ST elevation or depression.  No Q-waves present.   Chest x-ray showed minimal  cardiac margin.  No acute abnormalities.   ASSESSMENT AND PLAN:  This is a 60 year old female who presented to the  ED with lightheadedness and chest tightness.  The patient's chest  tightness is atypical in nature, but given her significant risk factors  and family history, we will cycle cardiac enzymes and repeat EKG.  The  patient would be placed on aspirin and a low-dose beta-blocker with  caution given that the patient has a history of bradycardia.  The  patient's heart rate is normal at this time.  We will discuss the  continued need for an inpatient stress testing versus outpatient workup.  The patient will be likely discharged on time for followup echo on June 02, 2009.  1. Hypokalemia likely secondary to hydrochlorothiazide.  We will      replete the patient with 5 mEq p.o. x2 and then 20 mEq daily on      discharge.  2. Dizziness, questionable orthostatic by history versus bradycardia.      This is an isolated incident, now resolved.  The patient does not      appear dry on physical exam or urinalysis.  We will continue to      monitor.  3. Hypertension.  We will continue the patient's home medications of      Norvasc and hydrochlorothiazide and add beta-blocker for rule out      myocardial infarction.  We will recheck blood pressure.  The      patient will benefit from the addition of an ACE inhibitor if her      blood pressure continues to be elevated.  4. Hyperlipidemia.  The patient recently had a fasting lipid panel on      May 28, 2009, with an LDL of 178.  A statin was started at this      time.  We will continue on this admission.  5. Fluids, electrolytes, nutrition/gastrointestinal, saline lock IV.      Regular diet.  6. Prophylaxis.  The patient has no need for proton pump inhibitor as      she is not acutely ill and taking full diet.  We will start her on      heparin 5000 units t.i.d. for deep vein thrombosis prophylaxis.  DISPOSITION:  This will likely be a  short hospital course and will be  discharged once MI ruled out.      Delbert Harness, MD  Electronically Signed      Pearlean Brownie, M.D.  Electronically Signed    KB/MEDQ  D:  06/01/2009  T:  06/02/2009  Job:  [id number]

## 2011-05-08 NOTE — Discharge Summary (Signed)
Laurie [name], [name]            ACCOUNT NO.:  [id number]   MEDICAL RECORD NO.:  [id number]          PATIENT TYPE:  INP   LOCATION:  4740                         FACILITY:  [facility]   PHYSICIAN:  Santiago Bumpers. Hensel, M.D.DATE OF BIRTH:  October 28, 1951   DATE OF ADMISSION:  05/27/2009  DATE OF DISCHARGE:  05/28/2009                               DISCHARGE SUMMARY   DISCHARGE DIAGNOSES:  1. Hypertensive urgency.  2. Bradycardia.  3. Hyperlipidemia.  4. Hypokalemia.   DISCHARGE MEDICATIONS:  1. Norvasc 10 mg p.o. daily.  2. Hydrochlorothiazide 25 mg p.o. daily.  3. Simvastatin 40 mg p.o. daily.  4. Aspirin 81 mg p.o. daily.   PERTINENT LABORATORIES AND STUDIES:  The patient had 3 sets of cardiac  enzymes that were negative.  Her potassium was normal on admission at  3.5.  At the time of discharge, it was slightly low at 3.3.  She had a  normal CBC.  Her kidney function was normal with a creatinine of 0.59.  She had a fasting glucose of 98, a hemoglobin A1c of 5.2, total  cholesterol 226, triglycerides 54, HDL cholesterol 37, and LDL  cholesterol 178.  Chest x-ray showed cardiomegaly, increased from  previous studies, but no acute process.  EKGs showed sinus bradycardia,  LVH, and were unchanged from previous admissions.  No ST depression or  elevation.   CONSULTS:  None.   HOSPITAL COURSE:  Laurie [name] is a 60 year old African American female  who was admitted from the family practice center yesterday with  hypertensive urgency with a blood pressure 260/125.  She was  asymptomatic at that time with no evidence of any end-organ damage,  although she did have a significant bradycardia on EKG and some possible  flipped T-waves that were not present before on prior EKGs.  She was  admitted for safe blood pressure lowering and further cardiac workup.  1. Hypertension.  The patient's blood pressure was safely lowered      during the hospitalization to 153-190/80s-90s.  The patient  reported feeling improved with less fatigue.  She denied chest      pain, shortness of breath, edema, dizziness, headache, or fatigue.      We will continue her on the above medications and further adjust      her blood pressure medicines as an outpatient.  The next agent to      be used will be an ACE inhibitor given that her creatinine is      normal.  2. Bradycardia.  The patient was noted to have bradycardia to 40s      despite not having taken her beta-blocker on the morning of      admission.  This was concerning for possible cardiac ischemia in      combination with some flipped T-waves on her EKG.  She was admitted      for cardiac rule out.  She had 3 sets of negative cardiac enzymes      and her EKG remained unchanged during the hospitalization and she      denied any chest pain.  She remained bradycardiac in the  40s      throughout the hospital stay and remained asymptomatic.  She will      have an echocardiogram as an outpatient and we will arrange for her      to see her cardiologist, Dr. Katrinka Blazing for further evaluation of her      bradycardia.  She will continue on her baby aspirin.  3. Hyperlipidemia.  The patient's cholesterol was found be elevated      with an LDL of 176.  We will start Zocor 40 and increase to 80 in 2      weeks.  LFTs were within normal limits.  4. Fatigue.  The patient had normal electrolytes, normal fasting      glucose, normal hemoglobin A1c, normal hemoglobin, normal kidney      function.  Her fatigue was most likely secondary to a combination      of her severe hypertension, recent stress, and bradycardia.  It is      improved at the time of discharge.  5. Hypokalemia.  The patient was slightly hypokalemic on the morning      of discharge at 3.3.  She was given 60 mEq of KCl prior to      discharge and we will recheck her potassium on Monday in the      clinic.   DISCONTINUED MEDICATION:  The patient is to stop taking atenolol because  of her  bradycardia.   FOLLOWUP INSTRUCTIONS:  The patient is to call family practice center  between 8:30 and 9 on Monday morning to be scheduled with Dr. Luz Brazen,  Monday, Tuesday, or Wednesday of next week.  She will come in on Monday  to have her potassium checked.  At the time of followup, we will follow  up her blood pressure as well as arrange for her to have an outpatient 2-  D echocardiogram and will arrange an appointment with her previous  cardiologist, Dr. Katrinka Blazing.      Levander Campion, M.D.  Electronically Signed      Santiago Bumpers. Leveda Anna, M.D.  Electronically Signed    JH/MEDQ  D:  05/28/2009  T:  05/29/2009  Job:  [id number]

## 2011-05-08 NOTE — H&P (Signed)
[name], [name]            ACCOUNT NO.:  [id number]   MEDICAL RECORD NO.:  [id number]          PATIENT TYPE:  INP   LOCATION:  4740                         FACILITY:  [facility]   PHYSICIAN:  Santiago Bumpers. Hensel, M.D.DATE OF BIRTH:  12-10-1951   DATE OF ADMISSION:  05/27/2009  DATE OF DISCHARGE:  05/28/2009                              HISTORY & PHYSICAL   PRIMARY CARE PHYSICIAN:  Levander Campion, MD.   CHIEF COMPLAINT:  Hypertensive urgency and bradycardia.   HISTORY OF PRESENT ILLNESS:  [name] is a 60 year old African  American female whom I am meeting for the first time today who presents  to the family practice center for the first time in over 2 years.  She  is complaining of increased fatigue over the past month or so with  increased urination.  She came because she wanted to be checked for  diabetes.  She has a history of severe poorly-controlled hypertension  and was formerly on a regimen of quinapril, Norvasc,  hydrochlorothiazide, and atenolol 100 mg daily, but over the past 2  years, has only been taking atenolol.  She had not taken it this  morning.  It was noted that her blood pressure was 210/100 with a pulse  of 70.  She denies chest pain, shortness of breath, vision changes,  dizziness, difficulty speaking, walking, confusion, edema, weakness, or  numbness.  She complains only of fatigue and sleeping more lately.  In  2004, she complained of shortness of breath with walking and she  underwent an exercise treadmill test that was high risk and was referred  to Dr. Verdis Prime who performed a cardiac cath that showed no blockages  and a normal EF.  She has not had any cardiac workup since that time.  Looking at her previous office visits, her blood pressure was frequently  190-220s/90s to 110s.  She was also formerly on Zocor for hyperlipidemia  but stopped that over 2 years ago.  She is only currently taking  atenolol and baby aspirin.  We had the patient take  her atenolol and  rechecked her blood pressure 45 minutes later, and it had increased to  260/125.  She remained asymptomatic.  An EKG was obtained and showed  sinus bradycardia with a rate of 47, inverted T-waves in leads I, II,  and III and V4-V6.  No ST-segment elevation or depression.  The EKG met  criteria for LVH.  Her old EKGs from 2004, revealed sinus bradycardia in  the 50s to 60s with inverted T-waves in II and V5.   PAST MEDICAL HISTORY:  1. Hypertension.  2. Hyperlipidemia.  3. Obesity.  4. History of hypokalemia.   CURRENT MEDICATIONS:  1. Aspirin 81 mg p.o. daily.  2. Atenolol 100 mg p.o. daily.   ALLERGIES:  No known drug allergies.   FAMILY HISTORY:  She had a brother who has severe hypertension and had a  recent hemorrhagic CVA.  He also has diabetes.  His sister had  hypertension, CVA, and an MI, and died at age 60.  Her mother had  hypertension and an MI, and died at  age 73.  Her father had  hypertension.  She had another sister who had stomach and liver cancer,  and she has nieces and nephews with diabetes.   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  She has a history of tobacco abuse but quit 20 years ago.  She does not drink alcohol and she does not use illicit drugs.   REVIEW OF SYSTEMS:  Ten systems were reviewed and were negative except  for as listed in HPI.   PHYSICAL EXAMINATION:  VITAL SIGNS:  Initial blood pressure was 210/100,  repeat blood pressure was 260/125 and pulse was 70.  GENERAL:  A pleasant female who is alert and oriented x3, in no acute  distress.  HEAD:  Normocephalic and atraumatic.  EYES:  Vision is grossly intact.  Pupils are equal, round and reactive  to light and accommodation.  There were no optic disc abnormalities and  no retinal abnormalities detected.  MOUTH:  Moist mucous membranes.  Oropharynx clear without lesions.  NECK:  Supple.  No JVD and no bruits were appreciated.   LUNGS:  The patient had normal respiratory effort.  Clear to  auscultation.  No crackles or wheezes.  HEART:  Sinus bradycardia, regular rhythm, and 2/6 systolic murmur.  ABDOMEN:  Bowel sounds are positive.  Soft and nontender without masses.  EXTREMITIES:  The patient had 2+ dorsalis pedis pulses in bilateral  lower extremities.  The patient had no clubbing, cyanosis, or edema.  NEUROLOGIC:  The patient was alert and oriented x3.  Cranial nerves II  through XII are intact.  Strength is normal in all extremities.  Sensation was intact to light touch.  Gait was normal.  DTRs were  symmetrical and normal.  Finger-to-nose was normal and Romberg was  negative.   ASSESSMENT AND PLAN:  This is a 60 year old female who presented to the  clinic complaining of fatigue and was found to have hypertensive urgency  versus emergency with marked bradycardia on EKG.  1. Hypertension.  This is most likely hypertensive urgency, however,      given extremely high blood pressure of 260/125, and EKG findings of      flipped T-waves in several new leads compared to old EKGs and      bradycardia despite not having had her beta-blocker, we will admit      the patient for safe blood pressure lowering and further cardiac      workup.  We will cycle cardiac enzymes x3 and repeat an EKG.  We      will obtain a 2-D echocardiogram.  She does have a murmur on exam      and this is not mentioned in previous notes, it is unclear if this      is new or not.  We will check creatinine for signs of end organ      damage.  No neurologic abnormalities noted.  No need for head      imaging at this time.  We will start Norvasc, hydrochlorothiazide,      and as needed hydralazine to lower blood pressure to below 220/110.      We will avoid beta-blockers due to her bradycardia.  2. Hyperlipidemia.  The patient was formerly on a statin.  We will      check a fasting lipid panel and treat accordingly.  3. Fluids, electrolytes,  nutrition/gastrointestinal:  We will check      electrolytes.  Given her history of hypokalemia, she will have a      heart healthy diet.  We will saline locker IV.  4. Prophylaxis.  Heparin for deep venous thrombosis prophylaxis.  5. Disposition.  Pending safe blood pressure lowering and cardiac      workup.     Levander Campion, M.D.  Electronically Signed      Santiago Bumpers. Leveda Anna, M.D.  Electronically Signed   JH/MEDQ  D:  05/28/2009  T:  05/29/2009  Job:  [id number]

## 2011-05-11 NOTE — Cardiovascular Report (Signed)
   NAMEMADDISON, KILNER                        ACCOUNT NO.:  [id number]   MEDICAL RECORD NO.:  [id number]                   PATIENT TYPE:  OIB   LOCATION:  2899                                 FACILITY:  [facility]   PHYSICIAN:  Lesleigh Noe, M.D.            DATE OF BIRTH:  05-Sep-1951   DATE OF PROCEDURE:  02/04/2003  DATE OF DISCHARGE:                              CARDIAC CATHETERIZATION   INDICATIONS:  The patient is 66 and has been having exertional chest  discomfort and on an exercise treadmill test developed ventricular bigeminy  with exercise.  She was later found to have hypokalemia but in light of the  exercise-induced chest discomfort, coronary angiography is being performed  to rule out CAD.   PROCEDURES:  1. Left heart catheterization.  2. Selective coronary angiography.  3. Left ventriculography.  4. Perclose.   DESCRIPTION OF PROCEDURE:  After informed consent, a 6 French sheath was  placed in the right femoral artery using modified Seldinger technique.  A 6  French A2 multipurpose catheter was used for hemodynamic recording, left  ventriculography, and selective left and right coronary angiography.   Following the procedure, a sheathgram was performed in the right femoral  artery and Perclose arteriotomy closure was performed.  No complications  occurred.  Good hemostasis was achieved.   RESULTS:   HEMODYNAMIC DATA:  1. Aortic pressure 185/86.  2. Left ventricular pressure 185/22.   LEFT VENTRICULOGRAPHY:  The left ventricle is normal in size and  demonstrates normal contractility at 65.   SELECTIVE CORONARY ANGIOGRAPHY:  1. Left main coronary:  Normal.  2. Left anterior descending coronary:  Mild proximal and midvessel luminal     irregularity.  3. Circumflex artery:  Normal.  4. Right coronary:  Normal.   CONCLUSION:  1. Essentially normal coronaries.  There are luminal irregularities in the     left anterior descending.  2. Normal left  ventricular function with elevated left ventricular end-     diastolic pressure consistent with diastolic dysfunction.  3. Hypertension during procedure suggests that antihypertensive therapy     should be intensified.    PLAN:  1. Intensification of antihypertensive therapy.  2. Treat hypokalemia.  3. Further management per Dr. Drue Second.                                               Lesleigh Noe, M.D.    HWS/MEDQ  D:  02/04/2003  T:  02/04/2003  Job:  [id number]   cc:   Dalbert Mayotte, M.D.  [facility]  [place], [place] [address]  Fax: [phone]   Sibyl Parr. Fields, M.D.  [address] [place]  [place] [address]  Fax: [phone]

## 2011-05-11 NOTE — Discharge Summary (Signed)
[name], [name]                        ACCOUNT NO.:  [id number]   MEDICAL RECORD NO.:  [id number]                   PATIENT TYPE:  INP   LOCATION:  2039                                 FACILITY:  [facility]   PHYSICIAN:  Douglass Rivers, M.D.                DATE OF BIRTH:  05/05/1951   DATE OF ADMISSION:  12/24/2002  DATE OF DISCHARGE:  12/26/2002                                 DISCHARGE SUMMARY   DISCHARGE DIAGNOSES:  1. Chest pain, no evidence for acute myocardial infarction.  2. Hyperlipidemia.  3. Hypertension.  4. Hypokalemia.  5. Strong family history for acute myocardial infarction.  6. Use of tobacco.   DISCHARGE MEDICATIONS:  1. Atenolol 25 mg one p.o. q.d.  2. Hydrochlorothiazide 25 mg one p.o. q.d.  3. Lipitor 10 mg one p.o. q.d.  4. Aspirin 325 mg one p.o. q.d.   DIET:  The patient was recommended to continue __________ cholesterol food,  and to try to eat high fiber food.   DISCHARGE INSTRUCTIONS:  The patient was recommended to seek medical  attention immediately if he has episodes of chest pain that is pressure-like  and radiating to the arm, back, neck, or jaw.   FOLLOWUP:  The patient is instructed to call the [facility] for an appointment with Dr. Drue Second in two to three weeks.  The  patient is also instructed that she needs to schedule an exercise stress  test.   LABORATORY DATA:  Potassium at discharge was 3.2, creatinine 0.8, TSH  2.5666.  Fasting lipid panel:  Cholesterol 207, triglycerides 71, LDL 149,  HDL 44.   PROCEDURES:  Chest x-ray revealed cardiomegaly, vascular congestion, no  edema, no infiltrate on 12/24/02.  Echocardiogram on 12/25/02, revealed an  ejection fraction of 55 to 65% with normal left ventricular function.   HOSPITAL COURSE:  The patient is a 60 year old female who was admitted for  atypical chest pain to rule out myocardial infarction.  Problem 1.  CHEST PAIN:  This episode was not consistent with a  cardiac  origin.  Appeared more musculoskeletal by history and examination.  There  was no evidence for acute myocardial infarction by cardiac enzymes or EKG.  An echocardiogram was done due to the cardiomegaly and vascular congestion  on his chest x-ray and was normal.  However, with the patient with a history  of hypertension, hypercholesterolemia, and strong family history of  (multiple family members have either have acute myocardial infarctions or  have died from acute myocardial infarctions, and a few of them in their  10's), and a history of chest pain on exertion in the past (the patient had  approximately one month prior to admission).  The patient is to have stress  testing as an outpatient.  In addition, we will start on an aspirin q.d.  Problem 2.  HYPERLIPIDEMIA:  The patient had discontinued Lipitor in the  past due to side effects.  She agrees to restart Lipitor, and plans were  discussed and side effects that require seeing a doctor.  Problem 3.  HYPERTENSION:  Was stable on hydrochlorothiazide and atenolol.  We will continue same doses as on admission.  Problem 4.  HYPOKALEMIA:  The patient had a potassium of 3.2 on admission.  This remained stable despite moderate p.o. repletion.  This is probably  secondary to hydrochlorothiazide.  Recommend continued followup as an  outpatient basis as the patient is asymptomatic and the potassium is mildly  decreased.  Problem 5.  DISPOSITION:  The patient is discharged home today with  prescriptions and instructions as previously described.                                               Douglass Rivers, M.D.    CH/MEDQ  D:  12/26/2002  T:  12/26/2002  Job:  [id number]   cc:   Dalbert Mayotte, M.D.  [facility]  [place], [place] [address]  Fax: [phone]

## 2011-06-20 ENCOUNTER — Other Ambulatory Visit: Payer: Self-pay | Admitting: Family Medicine

## 2011-06-29 ENCOUNTER — Other Ambulatory Visit: Payer: Self-pay | Admitting: Family Medicine

## 2011-06-29 DIAGNOSIS — I10 Essential (primary) hypertension: Secondary | ICD-10-CM

## 2011-06-29 DIAGNOSIS — E7800 Pure hypercholesterolemia, unspecified: Secondary | ICD-10-CM

## 2011-06-29 MED ORDER — LISINOPRIL-HYDROCHLOROTHIAZIDE 20-12.5 MG PO TABS: 1.0000 | ORAL_TABLET | Freq: Every day | ORAL | Status: DC

## 2011-06-29 MED ORDER — SIMVASTATIN 40 MG PO TABS: 40.0000 mg | ORAL_TABLET | Freq: Every day | ORAL | Status: DC

## 2011-06-29 NOTE — Telephone Encounter (Signed)
Pt calling re: bp med refill, says it was denied for lisinopril, pt is completely out and has an appt on 8/1, wants to know if MD can fill until her appt? [facility]

## 2011-06-29 NOTE — Telephone Encounter (Signed)
Patient was seen in our office 10/2011. She has not followed up as recommended. Apparently Dr. Nicanor Alcon office refilled her med in 01/2011 but it appears she has not seen him at his new office. Spoke with patient and she needs simvastatin also.   Consulted Dr. Deirdre Priest and he advises ok to refill for one month . Tried to call patient back but no answer.

## 2011-07-25 ENCOUNTER — Ambulatory Visit: Payer: BC Managed Care – PPO | Admitting: Family Medicine

## 2011-08-03 ENCOUNTER — Ambulatory Visit: Payer: BC Managed Care – PPO | Admitting: Family Medicine

## 2011-08-17 ENCOUNTER — Encounter: Payer: Self-pay | Admitting: Family Medicine

## 2011-08-17 ENCOUNTER — Ambulatory Visit (INDEPENDENT_AMBULATORY_CARE_PROVIDER_SITE_OTHER): Payer: BC Managed Care – PPO | Admitting: Family Medicine

## 2011-08-17 VITALS — BP 162/104 | HR 76 | Temp 97.6°F | Ht 63.0 in | Wt 173.0 lb

## 2011-08-17 DIAGNOSIS — E7800 Pure hypercholesterolemia, unspecified: Secondary | ICD-10-CM

## 2011-08-17 DIAGNOSIS — I10 Essential (primary) hypertension: Secondary | ICD-10-CM

## 2011-08-17 DIAGNOSIS — Z23 Encounter for immunization: Secondary | ICD-10-CM

## 2011-08-17 LAB — LDL CHOLESTEROL, DIRECT: Direct LDL: 140 mg/dL — ABNORMAL HIGH

## 2011-08-17 MED ORDER — LISINOPRIL-HYDROCHLOROTHIAZIDE 20-12.5 MG PO TABS: 1.0000 | ORAL_TABLET | Freq: Every day | ORAL | Status: DC

## 2011-08-17 MED ORDER — POTASSIUM CHLORIDE 20 MEQ/15ML (10%) PO LIQD: 10.0000 meq | Freq: Every day | ORAL | Status: DC

## 2011-08-17 MED ORDER — SIMVASTATIN 40 MG PO TABS: 40.0000 mg | ORAL_TABLET | Freq: Every day | ORAL | Status: DC

## 2011-08-17 NOTE — Progress Notes (Signed)
  Subjective:    Patient ID: [name], female    DOB: 08-Jul-1951, 60 y.o.   MRN: [id number]  HPI Out of meds for 3 days.  Also stressed, brother in hosp and nearly died. Behind on health maint.     Review of Systems     Objective:   Physical Exam Lungs clear Cardiac RRR without m or g Abd benign Ext no edema        Assessment & Plan:

## 2011-08-17 NOTE — Assessment & Plan Note (Signed)
>>  ASSESSMENT AND PLAN FOR HYPERCHOLESTEROLEMIA WRITTEN ON 08/17/2011  2:19 PM BY HENSEL, Richrd Prime, MD  Check labs.  At risk with FHx of CAD.

## 2011-08-17 NOTE — Assessment & Plan Note (Signed)
Check labs.  At risk with FHx of CAD.

## 2011-08-17 NOTE — Assessment & Plan Note (Signed)
Refilled meds and check labs.  FU to make sure controled on BP meds.  Also start ASA for positive FHx of CAD.

## 2011-08-17 NOTE — Patient Instructions (Signed)
Make sure you stay on your medications.  With your family history, it is extremely important. Please schedule your mammogram.  Ask them to send copy to [name] at [facility] See me in 2 -3 weeks for a blood pressure recheck and Pap smear. I will also refer you for colonoscopy next visit.  I will also review your blood work results.  If there is any problem with your blood work I will call you sooner.

## 2011-08-18 LAB — COMPLETE METABOLIC PANEL WITH GFR
ALT: 10 U/L (ref 0–35)
Albumin: 4.3 g/dL (ref 3.5–5.2)
CO2: 24 mEq/L (ref 19–32)
Calcium: 9.5 mg/dL (ref 8.4–10.5)
Chloride: 106 mEq/L (ref 96–112)
GFR, Est African American: >60 mL/min (ref >60)
GFR, Est Non African American: >60 mL/min (ref >60)
Glucose, Bld: 91 mg/dL (ref 70–99)
Potassium: 4 mEq/L (ref 3.5–5.3)
Sodium: 142 mEq/L (ref 135–145)
Total Bilirubin: 0.7 mg/dL (ref 0.3–1.2)
Total Protein: 7.1 g/dL (ref 6.0–8.3)

## 2011-08-27 ENCOUNTER — Encounter: Payer: Self-pay | Admitting: Family Medicine

## 2011-09-07 ENCOUNTER — Ambulatory Visit: Payer: BC Managed Care – PPO | Admitting: Family Medicine

## 2011-09-17 LAB — CBC
HCT: 40.8
MCV: 87.9
Platelets: 138 — ABNORMAL LOW
RDW: 13.6

## 2011-09-17 LAB — DIFFERENTIAL
Basophils Absolute: 0
Lymphs Abs: 1.1
Monocytes Absolute: 0.4
Monocytes Relative: 17 — ABNORMAL HIGH
Neutrophils Relative %: 34 — ABNORMAL LOW

## 2011-09-17 LAB — BASIC METABOLIC PANEL
BUN: 11
Creatinine, Ser: 0.84
GFR calc non Af Amer: >60
Glucose, Bld: 108 — ABNORMAL HIGH
Potassium: 3.4 — ABNORMAL LOW

## 2011-09-17 LAB — PATHOLOGIST SMEAR REVIEW

## 2011-09-19 ENCOUNTER — Ambulatory Visit: Payer: BC Managed Care – PPO | Admitting: Family Medicine

## 2011-09-19 ENCOUNTER — Other Ambulatory Visit: Payer: Self-pay | Admitting: Interventional Radiology

## 2011-09-19 DIAGNOSIS — Z1231 Encounter for screening mammogram for malignant neoplasm of breast: Secondary | ICD-10-CM

## 2011-10-03 ENCOUNTER — Ambulatory Visit: Payer: BC Managed Care – PPO | Admitting: Family Medicine

## 2011-10-09 ENCOUNTER — Ambulatory Visit
Admission: RE | Admit: 2011-10-09 | Discharge: 2011-10-09 | Disposition: A | Discharge status: Home / Self Care | Payer: BC Managed Care – PPO | Source: Ambulatory Visit | Attending: Interventional Radiology | Admitting: Interventional Radiology

## 2011-10-09 DIAGNOSIS — Z1231 Encounter for screening mammogram for malignant neoplasm of breast: Secondary | ICD-10-CM

## 2011-10-26 ENCOUNTER — Ambulatory Visit (INDEPENDENT_AMBULATORY_CARE_PROVIDER_SITE_OTHER): Payer: BC Managed Care – PPO | Admitting: Family Medicine

## 2011-10-26 ENCOUNTER — Encounter: Payer: Self-pay | Admitting: Family Medicine

## 2011-10-26 DIAGNOSIS — E7800 Pure hypercholesterolemia, unspecified: Secondary | ICD-10-CM

## 2011-10-26 DIAGNOSIS — I10 Essential (primary) hypertension: Secondary | ICD-10-CM

## 2011-10-26 LAB — LIPID PANEL: LDL Cholesterol: 130 mg/dL — ABNORMAL HIGH (ref 0–99)

## 2011-10-26 MED ORDER — POTASSIUM CHLORIDE CRYS ER 20 MEQ PO TBCR: 20.0000 meq | EXTENDED_RELEASE_TABLET | Freq: Every day | ORAL | Status: DC

## 2011-10-26 MED ORDER — METOPROLOL SUCCINATE ER 100 MG PO TB24: 100.0000 mg | ORAL_TABLET | Freq: Every day | ORAL | Status: DC

## 2011-10-26 NOTE — Patient Instructions (Addendum)
You got a flu shot today.  Remember to tell me if it made you sick I  checked your cholesterol today.  I will call with results. See me in 3-4 weeks to make sure your blood pressure is coming down. You need a colonoscopy.  I will schedule it next visit provided your blood pressure is better.

## 2011-10-26 NOTE — Assessment & Plan Note (Signed)
Add b blocker.

## 2011-10-26 NOTE — Progress Notes (Signed)
  Subjective:    Patient ID: [name], female    DOB: [birth year], 60 y.o.   MRN: [id number]  HPI Can't take liquid potassium, would rather take pills Walking and has lost 5 lbs intentionally.    Review of Systems     Objective:   Physical Exam BP still markedly elevated.   Cardiac RRR without m or g        Assessment & Plan:

## 2011-11-21 ENCOUNTER — Ambulatory Visit: Payer: BC Managed Care – PPO | Admitting: Family Medicine

## 2011-11-30 ENCOUNTER — Ambulatory Visit: Payer: BC Managed Care – PPO | Admitting: Family Medicine

## 2011-12-14 ENCOUNTER — Ambulatory Visit: Payer: BC Managed Care – PPO | Admitting: Family Medicine

## 2012-08-28 ENCOUNTER — Other Ambulatory Visit: Payer: Self-pay | Admitting: Family Medicine

## 2012-11-22 ENCOUNTER — Encounter (HOSPITAL_COMMUNITY): Payer: Self-pay | Admitting: Emergency Medicine

## 2012-11-22 ENCOUNTER — Emergency Department (HOSPITAL_COMMUNITY)
Admission: EM | Admit: 2012-11-22 | Discharge: 2012-11-22 | Disposition: A | Discharge status: Home / Self Care | Payer: BC Managed Care – PPO | Attending: Emergency Medicine | Admitting: Emergency Medicine

## 2012-11-22 ENCOUNTER — Emergency Department (HOSPITAL_COMMUNITY): Payer: BC Managed Care – PPO

## 2012-11-22 DIAGNOSIS — Z79899 Other long term (current) drug therapy: Secondary | ICD-10-CM | POA: Insufficient documentation

## 2012-11-22 DIAGNOSIS — R319 Hematuria, unspecified: Secondary | ICD-10-CM

## 2012-11-22 DIAGNOSIS — Z87891 Personal history of nicotine dependence: Secondary | ICD-10-CM | POA: Insufficient documentation

## 2012-11-22 DIAGNOSIS — Z7982 Long term (current) use of aspirin: Secondary | ICD-10-CM | POA: Insufficient documentation

## 2012-11-22 DIAGNOSIS — R109 Unspecified abdominal pain: Secondary | ICD-10-CM | POA: Insufficient documentation

## 2012-11-22 DIAGNOSIS — E876 Hypokalemia: Secondary | ICD-10-CM | POA: Insufficient documentation

## 2012-11-22 DIAGNOSIS — I10 Essential (primary) hypertension: Secondary | ICD-10-CM | POA: Insufficient documentation

## 2012-11-22 HISTORY — DX: Essential (primary) hypertension: I10

## 2012-11-22 LAB — CBC
HCT: 33.9 % — ABNORMAL LOW (ref 36.0–46.0)
MCH: 30 pg (ref 26.0–34.0)
MCHC: 35.4 g/dL (ref 30.0–36.0)
MCV: 84.8 fL (ref 78.0–100.0)
RDW: 13.5 % (ref 11.5–15.5)
WBC: 8.2 10*3/uL (ref 4.0–10.5)

## 2012-11-22 LAB — PROTIME-INR
INR: 1.1 (ref 0.00–1.49)
Prothrombin Time: 14.1 seconds (ref 11.6–15.2)

## 2012-11-22 LAB — URINE MICROSCOPIC-ADD ON

## 2012-11-22 LAB — BASIC METABOLIC PANEL
BUN: 18 mg/dL (ref 6–23)
Calcium: 9.7 mg/dL (ref 8.4–10.5)
Chloride: 102 mEq/L (ref 96–112)
Creatinine, Ser: 0.73 mg/dL (ref 0.50–1.10)
GFR calc Af Amer: >90 mL/min (ref >90)

## 2012-11-22 LAB — URINALYSIS, ROUTINE W REFLEX MICROSCOPIC
Nitrite: NEGATIVE
Specific Gravity, Urine: 1.013 (ref 1.005–1.030)
pH: 5 (ref 5.0–8.0)

## 2012-11-22 MED ORDER — CIPROFLOXACIN HCL 500 MG PO TABS
500.0000 mg | ORAL_TABLET | Freq: Once | ORAL | Status: AC
  Administered 2012-11-22: 500 mg via ORAL
  Filled 2012-11-22: qty 1

## 2012-11-22 MED ORDER — CIPROFLOXACIN HCL 500 MG PO TABS: 500.0000 mg | ORAL_TABLET | Freq: Two times a day (BID) | ORAL | Status: DC

## 2012-11-22 MED ORDER — POTASSIUM CHLORIDE CRYS ER 20 MEQ PO TBCR: 20.0000 meq | EXTENDED_RELEASE_TABLET | Freq: Two times a day (BID) | ORAL | Status: DC

## 2012-11-22 MED ORDER — POTASSIUM CHLORIDE CRYS ER 20 MEQ PO TBCR
40.0000 meq | EXTENDED_RELEASE_TABLET | Freq: Once | ORAL | Status: AC
  Administered 2012-11-22: 40 meq via ORAL
  Filled 2012-11-22: qty 2

## 2012-11-22 NOTE — ED Provider Notes (Signed)
History     CSN: [id number]  Arrival date & time 11/22/12  1433   First MD Initiated Contact with Patient 11/22/12 1735      Chief Complaint  Patient presents with  . Hematuria    (Consider location/radiation/quality/duration/timing/severity/associated sxs/prior treatment) Patient is a 61 y.o. female presenting with hematuria. The history is provided by the patient.  Hematuria This is a new problem. The current episode started today. The problem is unchanged. She describes the hematuria as gross hematuria. The hematuria occurs throughout her entire urinary stream. She reports no clotting in her urine stream. Her pain is at a severity of 6/10. The pain is mild. She describes her urine color as dark red. Irritative symptoms do not include nocturia or urgency. Obstructive symptoms do not include dribbling, a slower stream or a weak stream. Associated symptoms include abdominal pain (mild, lower, constant since she ate this morning). Pertinent negatives include no chills, dysuria, fever, flank pain, nausea or vomiting. Her past medical history is significant for hypertension.    Past Medical History  Diagnosis Date  . Hypertension     History reviewed. No pertinent past surgical history.  History reviewed. No pertinent family history.  History  Substance Use Topics  . Smoking status: Former Smoker -- 5 years    Quit date: 08/16/1989  . Smokeless tobacco: Never Used  . Alcohol Use: No    OB History    Grav Para Term Preterm Abortions TAB SAB Ect Mult Living                  Review of Systems  Constitutional: Negative for fever and chills.  Respiratory: Negative for cough and shortness of breath.   Gastrointestinal: Positive for abdominal pain (mild, lower, constant since she ate this morning). Negative for nausea and vomiting.  Genitourinary: Positive for hematuria. Negative for dysuria, urgency, flank pain and nocturia.  All other systems reviewed and are  negative.    Allergies  Review of patient's allergies indicates no known allergies.  Home Medications   Current Outpatient Rx  Name  Route  Sig  Dispense  Refill  . ASPIRIN 81 MG PO TABS   Oral   Take 81 mg by mouth daily.           Marland Kitchen LISINOPRIL-HYDROCHLOROTHIAZIDE 20-12.5 MG PO TABS   Oral   Take 1 tablet by mouth daily.         Marland Kitchen METOPROLOL SUCCINATE ER 100 MG PO TB24   Oral   Take 1 tablet (100 mg total) by mouth daily.   30 tablet   12   . POTASSIUM CHLORIDE CRYS ER 20 MEQ PO TBCR   Oral   Take 1 tablet (20 mEq total) by mouth daily.   30 tablet   12   . SIMVASTATIN 40 MG PO TABS   Oral   Take 1 tablet (40 mg total) by mouth at bedtime.   30 tablet   12     BP 198/94  Pulse 72  Temp 99 F (37.2 C) (Oral)  Resp 18  SpO2 95%  Physical Exam  Nursing note and vitals reviewed. Constitutional: She is oriented to person, place, and time. She appears well-developed and well-nourished. No distress.  HENT:  Head: Normocephalic and atraumatic.  Eyes: EOM are normal. Pupils are equal, round, and reactive to light.  Neck: Normal range of motion. Neck supple.  Cardiovascular: Normal rate and regular rhythm.  Exam reveals no friction rub.   No murmur  heard. Pulmonary/Chest: Effort normal and breath sounds normal. No respiratory distress. She has no wheezes. She has no rales.  Abdominal: Soft. She exhibits no distension. There is tenderness (mild, lower). There is no rebound.  Musculoskeletal: Normal range of motion. She exhibits no edema.  Neurological: She is alert and oriented to person, place, and time.  Skin: She is not diaphoretic.    ED Course  Procedures (including critical care time)  Labs Reviewed  URINALYSIS, ROUTINE W REFLEX MICROSCOPIC - Abnormal; Notable for the following:    Color, Urine RED (*)  BIOCHEMICALS MAY BE AFFECTED BY COLOR   APPearance TURBID (*)     Hgb urine dipstick LARGE (*)     Bilirubin Urine MODERATE (*)     Ketones, ur  15 (*)     Protein, ur 100 (*)     Leukocytes, UA MODERATE (*)     All other components within normal limits  URINE MICROSCOPIC-ADD ON  CBC  BASIC METABOLIC PANEL   Ct Abdomen Pelvis Wo Contrast  11/22/2012  *RADIOLOGY REPORT*  Clinical Data: Hematuria without pain  CT ABDOMEN AND PELVIS WITHOUT CONTRAST  Technique:  Multidetector CT imaging of the abdomen and pelvis was performed following the standard protocol without intravenous contrast.  Comparison: None  Findings: No kidney stones are identified.  There is no hydronephrosis.  There is increased density in the left upper pole calix which may be due to blood.  No underlying mass lesion is identified.  Further evaluation of this area is suggested to rule out a mass lesion.  11 mm right kidney lesion is indeterminate.  Lung bases are clear.  Liver and gallbladder are normal.  Pancreas and spleen are normal.  Negative for bowel obstruction or bowel thickening.  Surgical clips in the right lower quadrant may be from prior appendectomy.  No adenopathy or free fluid.  IMPRESSION: Negative for urinary tract calculi or hydronephrosis.  There is increased density in the left upper pole calix which may be due to blood in the urine.  No underlying mass lesion.  Follow-up MRI of the kidneys without  and with contrast is suggested to exclude a mass or other cause for bleeding.   Original Report Authenticated By: Janeece Riggers, M.D.      1. Hematuria   2. Hypokalemia       MDM   Patient is a 61 year old female with history of hypertension who presents with painless hematuria. Had 2 episodes today. She states some mild lower abdominal pain has been constant since she ate this morning. She says it feels like "food is disagreeing with her." No history of kidney stones or family history of bladder cancer. We will check some labs and that noncontrasted CT of her abdomen. CT with small density in L kidney, unclear if mass, can pursue non-emergent f/u. Urology  consulted and will see patient as soon as possible in clinic, likely within 2-3 days.  Patient given 40 mEq of K here, given Rx for K for 4 doses.  Elwin Mocha, MD 11/22/12 [phone]

## 2012-11-22 NOTE — ED Provider Notes (Signed)
I saw and evaluated the patient, reviewed the resident's note and I agree with the findings and plan. Patient here with son onset of painless hematuria x1 day. No flank pain or fever. No prior history of same. Patient to have renal function an abdominal CT which is pending at this time  Toy Baker, MD 11/22/12 1821

## 2012-11-22 NOTE — ED Notes (Signed)
Pt c/o hematuria x 2 today with some lower abd pain

## 2012-11-23 NOTE — ED Provider Notes (Signed)
I saw and evaluated the patient, reviewed the resident's note and I agree with the findings and plan.  Toy Baker, MD 11/23/12 2157

## 2012-11-24 LAB — URINE CULTURE: Colony Count: >=100000

## 2012-11-25 NOTE — ED Notes (Signed)
+   urine Patient treated with Cipro-sensitive to same-Chart appended per protocol MD. 

## 2012-12-03 ENCOUNTER — Other Ambulatory Visit: Payer: Self-pay | Admitting: Family Medicine

## 2013-01-13 ENCOUNTER — Other Ambulatory Visit: Payer: Self-pay | Admitting: Family Medicine

## 2013-02-24 ENCOUNTER — Other Ambulatory Visit: Payer: Self-pay | Admitting: Family Medicine

## 2013-04-07 ENCOUNTER — Emergency Department (HOSPITAL_COMMUNITY)
Admission: EM | Admit: 2013-04-07 | Discharge: 2013-04-07 | Disposition: A | Discharge status: Home / Self Care | Payer: BC Managed Care – PPO | Attending: Emergency Medicine | Admitting: Emergency Medicine

## 2013-04-07 ENCOUNTER — Encounter (HOSPITAL_COMMUNITY): Payer: Self-pay | Admitting: Emergency Medicine

## 2013-04-07 ENCOUNTER — Emergency Department (HOSPITAL_COMMUNITY): Payer: BC Managed Care – PPO

## 2013-04-07 DIAGNOSIS — R112 Nausea with vomiting, unspecified: Secondary | ICD-10-CM

## 2013-04-07 DIAGNOSIS — R001 Bradycardia, unspecified: Secondary | ICD-10-CM

## 2013-04-07 DIAGNOSIS — Z87891 Personal history of nicotine dependence: Secondary | ICD-10-CM | POA: Insufficient documentation

## 2013-04-07 DIAGNOSIS — Z79899 Other long term (current) drug therapy: Secondary | ICD-10-CM | POA: Insufficient documentation

## 2013-04-07 DIAGNOSIS — I498 Other specified cardiac arrhythmias: Secondary | ICD-10-CM | POA: Insufficient documentation

## 2013-04-07 DIAGNOSIS — R51 Headache: Secondary | ICD-10-CM | POA: Insufficient documentation

## 2013-04-07 DIAGNOSIS — I10 Essential (primary) hypertension: Secondary | ICD-10-CM | POA: Insufficient documentation

## 2013-04-07 DIAGNOSIS — R42 Dizziness and giddiness: Secondary | ICD-10-CM | POA: Insufficient documentation

## 2013-04-07 DIAGNOSIS — R197 Diarrhea, unspecified: Secondary | ICD-10-CM | POA: Insufficient documentation

## 2013-04-07 LAB — URINE MICROSCOPIC-ADD ON

## 2013-04-07 LAB — CBC WITH DIFFERENTIAL/PLATELET
Basophils Absolute: 0 10*3/uL (ref 0.0–0.1)
Eosinophils Absolute: 0.1 10*3/uL (ref 0.0–0.7)
Eosinophils Relative: 2 % (ref 0–5)
Lymphocytes Relative: 31 % (ref 12–46)
MCH: 30 pg (ref 26.0–34.0)
MCV: 82.9 fL (ref 78.0–100.0)
Platelets: 190 10*3/uL (ref 150–400)
RDW: 13.9 % (ref 11.5–15.5)
WBC: 4.7 10*3/uL (ref 4.0–10.5)

## 2013-04-07 LAB — URINALYSIS, ROUTINE W REFLEX MICROSCOPIC
Bilirubin Urine: NEGATIVE
Ketones, ur: NEGATIVE mg/dL
Nitrite: NEGATIVE
Urobilinogen, UA: 0.2 mg/dL (ref 0.0–1.0)

## 2013-04-07 LAB — POCT I-STAT, CHEM 8
Hemoglobin: 12.2 g/dL (ref 12.0–15.0)
Sodium: 142 mEq/L (ref 135–145)
TCO2: 28 mmol/L (ref 0–100)

## 2013-04-07 LAB — POCT I-STAT TROPONIN I: Troponin i, poc: 0 ng/mL (ref 0.00–0.08)

## 2013-04-07 MED ORDER — SODIUM CHLORIDE 0.9 % IV BOLUS (SEPSIS)
1000.0000 mL | Freq: Once | INTRAVENOUS | Status: AC
  Administered 2013-04-07: 1000 mL via INTRAVENOUS

## 2013-04-07 MED ORDER — ONDANSETRON 8 MG PO TBDP: 8.0000 mg | ORAL_TABLET | Freq: Three times a day (TID) | ORAL | Status: DC | PRN

## 2013-04-07 NOTE — ED Notes (Signed)
Pt still need urine sample 

## 2013-04-07 NOTE — ED Notes (Addendum)
Pt stated some light headed around 2000, wentto  bed and wake up 0200 fever, nauseated, vomited and felt light headed agian, fainting and tingling to right hand.  Have friend drop her to Ed. HX of HTN

## 2013-04-07 NOTE — ED Provider Notes (Signed)
History     CSN: [id number]  Arrival date & time 04/07/13  4098   First MD Initiated Contact with Patient 04/07/13 [phone]      Chief Complaint  Patient presents with  . Fever    (Consider location/radiation/quality/duration/timing/severity/associated sxs/prior treatment) HPI Comments: Patient with past history of hypertension presents with chief complaint of nausea, vomiting, diarrhea, and lightheadedness. Patient states that approximately 8 PM last night she developed a headache prior to going to bed. She awoke at 2 AM and had nausea, one episode of nonbilious vomiting, and multiple episodes of nonbloody diarrhea. She states that she also feels lightheaded like she is going to pass out. She has not had full syncope. Patient denies fever, cold symptoms, chest pain, shortness of breath, abdominal pain, urinary symptoms. Patient states that her headache is improved but is still mild. Patient took Pepto-Bismol without relief of symptoms. Onset of symptoms acute. Course is constant. Nothing makes symptoms better or worse.  Patient is a 62 y.o. female presenting with fever. The history is provided by the patient.  Fever Associated symptoms: diarrhea, headaches, nausea and vomiting   Associated symptoms: no chest pain, no cough, no dysuria, no myalgias, no rash, no rhinorrhea and no sore throat     Past Medical History  Diagnosis Date  . Hypertension     History reviewed. No pertinent past surgical history.  No family history on file.  History  Substance Use Topics  . Smoking status: Former Smoker -- 5 years    Quit date: 08/16/1989  . Smokeless tobacco: Never Used  . Alcohol Use: No    OB History   Grav Para Term Preterm Abortions TAB SAB Ect Mult Living                  Review of Systems  Constitutional: Positive for fever.  HENT: Negative for sore throat and rhinorrhea.   Eyes: Negative for redness.  Respiratory: Negative for cough.   Cardiovascular: Negative for chest  pain.  Gastrointestinal: Positive for nausea, vomiting and diarrhea. Negative for abdominal pain and blood in stool.  Genitourinary: Negative for dysuria.  Musculoskeletal: Negative for myalgias.  Skin: Negative for rash.  Neurological: Positive for light-headedness and headaches. Negative for syncope and weakness.    Allergies  Review of patient's allergies indicates no known allergies.  Home Medications   Current Outpatient Rx  Name  Route  Sig  Dispense  Refill  . lisinopril-hydrochlorothiazide (PRINZIDE,ZESTORETIC) 20-12.5 MG per tablet      TAKE 1 TABLET BY MOUTH DAILY.   30 tablet   12     BP 177/86  Pulse 49  Temp(Src) 98.6 F (37 C) (Oral)  Resp 16  SpO2 99%  Physical Exam  Nursing note and vitals reviewed. Constitutional: She appears well-developed and well-nourished.  HENT:  Head: Normocephalic and atraumatic.  Mouth/Throat: Oropharynx is clear and moist.  Eyes: Conjunctivae are normal. Right eye exhibits no discharge. Left eye exhibits no discharge.  Neck: Normal range of motion. Neck supple.  Cardiovascular: Regular rhythm and normal heart sounds.  Bradycardia present.   Pulmonary/Chest: Effort normal and breath sounds normal.  Abdominal: Soft. There is no tenderness.  Neurological: She is alert.  Skin: Skin is warm and dry.  Psychiatric: She has a normal mood and affect.    ED Course  Procedures (including critical care time)  Labs Reviewed  CBC WITH DIFFERENTIAL - Abnormal; Notable for the following:    HCT 34.0 (*)    MCHC 36.2 (*)  All other components within normal limits  POCT I-STAT, CHEM 8 - Abnormal; Notable for the following:    Potassium 3.3 (*)    Glucose, Bld 110 (*)    All other components within normal limits  URINALYSIS, ROUTINE W REFLEX MICROSCOPIC  POCT I-STAT TROPONIN I   Dg Chest 2 View  04/07/2013  *RADIOLOGY REPORT*  Clinical Data: Fever; hypertension.  CHEST - 2 VIEW  Comparison: Chest radiograph performed 05/31/2009   Findings: The lungs are well-aerated.  Pulmonary vascularity is at the upper limits of normal.  Mild chronically increased interstitial markings are seen.  There is no evidence of focal opacification, pleural effusion or pneumothorax.  The heart is borderline enlarged.  No acute osseous abnormalities are seen.  IMPRESSION: Mild chronic lung changes noted; lungs otherwise clear.  Borderline cardiomegaly.   Original Report Authenticated By: Tonia Ghent, M.D.      1. Nausea vomiting and diarrhea   2. Sinus bradycardia    6:05 AMPatient seen and examined. Work-up initiated. Medications ordered.   Vital signs reviewed and are as follows: Filed Vitals:   04/07/13 0815  BP: 111/93  Pulse: 48  Temp:   Resp: 12   Patient d/w Dr. Nicanor Alcon.      Date: 04/07/2013  Rate: 49  Rhythm: normal sinus rhythm  QRS Axis: normal  Intervals: normal  ST/T Wave abnormalities: nonspecific T wave changes  Conduction Disutrbances:none  Narrative Interpretation: flat/inverted   Old EKG Reviewed: none available  9:09 AM urine results reviewed. Patient informed of all lab results. She has not vomited during ED stay. She is ambulatory without difficulty.  Will treat with Zofran and encourage fluids.  The patient was urged to return to the Emergency Department immediately with worsening of current symptoms, worsening abdominal pain, persistent vomiting, blood noted in stools, fever, or any other concerns. The patient verbalized understanding.     MDM  Patient with onset of nausea, vomiting, and diarrhea without abdominal pain or fever. Her abdomen is soft and nontender on exam. Workup is unrevealing. She has done well during emergency department stay. She appears well, nontoxic. Feel patient is safe for discharge to home with return if worsening.  Patient with symptoms consistent with viral gastroenteritis.  Vitals are stable, no fever.  No signs of dehydration, tolerating PO's.  Lungs are clear.  No focal  abdominal pain, no concern for appendicitis, cholecystitis, pancreatitis, ruptured viscus, UTI, kidney stone, or any other abdominal etiology.          Renne Crigler, PA-C 04/07/13 [phone]

## 2013-04-07 NOTE — ED Notes (Signed)
Patient ambulated from bed to restroom and tolerated well.

## 2013-04-08 NOTE — ED Provider Notes (Signed)
Medical screening examination/treatment/procedure(s) were performed by non-physician practitioner and as supervising physician I was immediately available for consultation/collaboration.  Jasmine Awe, MD 04/08/13 [phone]

## 2014-01-10 ENCOUNTER — Encounter (HOSPITAL_COMMUNITY): Payer: Self-pay | Admitting: Emergency Medicine

## 2014-01-10 ENCOUNTER — Observation Stay (HOSPITAL_COMMUNITY)
Admission: EM | Admit: 2014-01-10 | Discharge: 2014-01-11 | Disposition: A | Discharge status: Home / Self Care | Payer: BC Managed Care – PPO | Attending: Family Medicine | Admitting: Family Medicine

## 2014-01-10 ENCOUNTER — Emergency Department (HOSPITAL_COMMUNITY): Payer: BC Managed Care – PPO

## 2014-01-10 ENCOUNTER — Observation Stay (HOSPITAL_COMMUNITY): Payer: BC Managed Care – PPO

## 2014-01-10 DIAGNOSIS — I517 Cardiomegaly: Secondary | ICD-10-CM | POA: Diagnosis present

## 2014-01-10 DIAGNOSIS — R5381 Other malaise: Secondary | ICD-10-CM | POA: Insufficient documentation

## 2014-01-10 DIAGNOSIS — R0602 Shortness of breath: Secondary | ICD-10-CM | POA: Insufficient documentation

## 2014-01-10 DIAGNOSIS — I10 Essential (primary) hypertension: Secondary | ICD-10-CM | POA: Insufficient documentation

## 2014-01-10 DIAGNOSIS — E7800 Pure hypercholesterolemia, unspecified: Secondary | ICD-10-CM | POA: Insufficient documentation

## 2014-01-10 DIAGNOSIS — E782 Mixed hyperlipidemia: Secondary | ICD-10-CM | POA: Diagnosis present

## 2014-01-10 DIAGNOSIS — R5383 Other fatigue: Secondary | ICD-10-CM

## 2014-01-10 DIAGNOSIS — I160 Hypertensive urgency: Secondary | ICD-10-CM

## 2014-01-10 DIAGNOSIS — R9431 Abnormal electrocardiogram [ECG] [EKG]: Secondary | ICD-10-CM

## 2014-01-10 DIAGNOSIS — Z87891 Personal history of nicotine dependence: Secondary | ICD-10-CM | POA: Insufficient documentation

## 2014-01-10 DIAGNOSIS — M2550 Pain in unspecified joint: Secondary | ICD-10-CM | POA: Insufficient documentation

## 2014-01-10 DIAGNOSIS — Z9889 Other specified postprocedural states: Secondary | ICD-10-CM | POA: Insufficient documentation

## 2014-01-10 DIAGNOSIS — IMO0001 Reserved for inherently not codable concepts without codable children: Secondary | ICD-10-CM | POA: Insufficient documentation

## 2014-01-10 DIAGNOSIS — R079 Chest pain, unspecified: Secondary | ICD-10-CM

## 2014-01-10 DIAGNOSIS — Z23 Encounter for immunization: Secondary | ICD-10-CM | POA: Insufficient documentation

## 2014-01-10 DIAGNOSIS — R0789 Other chest pain: Principal | ICD-10-CM | POA: Insufficient documentation

## 2014-01-10 LAB — CBC WITH DIFFERENTIAL/PLATELET
BASOS PCT: 0 % (ref 0–1)
Basophils Absolute: 0 10*3/uL (ref 0.0–0.1)
EOS PCT: 1 % (ref 0–5)
Eosinophils Absolute: 0 10*3/uL (ref 0.0–0.7)
HEMATOCRIT: 37.5 % (ref 36.0–46.0)
HEMOGLOBIN: 13.7 g/dL (ref 12.0–15.0)
LYMPHS PCT: 18 % (ref 12–46)
Lymphs Abs: 1.2 10*3/uL (ref 0.7–4.0)
MCH: 31.4 pg (ref 26.0–34.0)
MCHC: 36.5 g/dL — AB (ref 30.0–36.0)
MCV: 85.8 fL (ref 78.0–100.0)
MONO ABS: 0.8 10*3/uL (ref 0.1–1.0)
MONOS PCT: 12 % (ref 3–12)
NEUTROS ABS: 4.4 10*3/uL (ref 1.7–7.7)
Neutrophils Relative %: 69 % (ref 43–77)
Platelets: 188 10*3/uL (ref 150–400)
RBC: 4.37 MIL/uL (ref 3.87–5.11)
RDW: 13.8 % (ref 11.5–15.5)
WBC: 6.4 10*3/uL (ref 4.0–10.5)

## 2014-01-10 LAB — BASIC METABOLIC PANEL
BUN: 19 mg/dL (ref 6–23)
CALCIUM: 9.7 mg/dL (ref 8.4–10.5)
CHLORIDE: 102 meq/L (ref 96–112)
CO2: 26 meq/L (ref 19–32)
CREATININE: 0.75 mg/dL (ref 0.50–1.10)
GFR calc Af Amer: >90 mL/min (ref >90)
GFR calc non Af Amer: 89 mL/min — ABNORMAL LOW (ref >90)
Glucose, Bld: 106 mg/dL — ABNORMAL HIGH (ref 70–99)
Potassium: 3.9 mEq/L (ref 3.7–5.3)
Sodium: 144 mEq/L (ref 137–147)

## 2014-01-10 LAB — POCT I-STAT TROPONIN I: Troponin i, poc: 0.01 ng/mL (ref 0.00–0.08)

## 2014-01-10 LAB — PRO B NATRIURETIC PEPTIDE: Pro B Natriuretic peptide (BNP): 97.6 pg/mL (ref 0–125)

## 2014-01-10 LAB — TROPONIN I

## 2014-01-10 MED ORDER — LISINOPRIL 20 MG PO TABS
20.0000 mg | ORAL_TABLET | Freq: Every day | ORAL | Status: DC
  Administered 2014-01-11: 20 mg via ORAL
  Filled 2014-01-10: qty 1

## 2014-01-10 MED ORDER — LISINOPRIL-HYDROCHLOROTHIAZIDE 20-12.5 MG PO TABS: 1.0000 | ORAL_TABLET | Freq: Every day | ORAL | Status: DC

## 2014-01-10 MED ORDER — ASPIRIN 81 MG PO CHEW
324.0000 mg | CHEWABLE_TABLET | Freq: Once | ORAL | Status: AC
  Administered 2014-01-10: 324 mg via ORAL
  Filled 2014-01-10: qty 4

## 2014-01-10 MED ORDER — ASPIRIN EC 325 MG PO TBEC
325.0000 mg | DELAYED_RELEASE_TABLET | Freq: Every day | ORAL | Status: DC
  Administered 2014-01-11: 325 mg via ORAL
  Filled 2014-01-10: qty 1

## 2014-01-10 MED ORDER — HEPARIN SODIUM (PORCINE) 5000 UNIT/ML IJ SOLN
5000.0000 [IU] | Freq: Three times a day (TID) | INTRAMUSCULAR | Status: DC
  Administered 2014-01-10 – 2014-01-11 (×2): 5000 [IU] via SUBCUTANEOUS
  Filled 2014-01-10 (×5): qty 1

## 2014-01-10 MED ORDER — ONDANSETRON HCL 4 MG/2ML IJ SOLN: 4.0000 mg | Freq: Four times a day (QID) | INTRAMUSCULAR | Status: DC | PRN

## 2014-01-10 MED ORDER — HYDRALAZINE HCL 20 MG/ML IJ SOLN: 10.0000 mg | INTRAMUSCULAR | Status: DC | PRN

## 2014-01-10 MED ORDER — HYDROCHLOROTHIAZIDE 12.5 MG PO CAPS
12.5000 mg | ORAL_CAPSULE | Freq: Every day | ORAL | Status: DC
  Administered 2014-01-11: 12.5 mg via ORAL
  Filled 2014-01-10: qty 1

## 2014-01-10 MED ORDER — IOHEXOL 350 MG/ML SOLN
100.0000 mL | Freq: Once | INTRAVENOUS | Status: AC | PRN
  Administered 2014-01-10: 100 mL via INTRAVENOUS

## 2014-01-10 MED ORDER — ACETAMINOPHEN 325 MG PO TABS: 650.0000 mg | ORAL_TABLET | ORAL | Status: DC | PRN

## 2014-01-10 MED ORDER — MORPHINE SULFATE 2 MG/ML IJ SOLN: 2.0000 mg | INTRAMUSCULAR | Status: DC | PRN

## 2014-01-10 NOTE — H&P (Signed)
[facility] Admission History and Physical Service Pager: [phone]  Patient name: [name] Medical record number: [id number] Date of birth: Jul 05, 1951 Age: 63 y.o. Gender: female  Primary Care Provider: Zigmund Gottron, MD Consultants: None Code Status: Full  Chief Complaint: Chest pain  Assessment and Plan: [name] is a 63 y.o. female presenting with chest pain. PMH is significant for HTN. Chest pain seems likely related to emotional disturbance with argument at church, but pt with significant risk factors.  # Chest Pain  - Nature of chest pain and HTN urgency concerning for ACS. FHx of MI Mother (77) & Sister (77). Previously evaluated by [facility] Dr Tamala Julian w/ Stress test (06/2009 in media tab): EF 58%. BNP 97 on admit - EKG: NSR w/ Twave inversions: 2,3 aVF, V4-6 which are unchanged from previous  - Admit to tele; Repeat EKG in AM - Cycle trops; istat trop neg in ED - CT chest Angio: to rule-out aortic dissection / coarctation   - Risk Stratification labs: TSH, Lipids, A1c & ECHO: pending - ASA 325 - Morphine for pain control  # Transient Weakness - Neuro exam normal on admission; Unlikely TIA but given risk factors, HTN, smoking hx, and HTN urgency will obtain CT head and consider further work-up pending results  # HTN Urgency - Continue lisinopril 20 mg qd & HCTZ 12.5 mg qd - Hydralazine prn to keep systolic BP < 027  - consider addition of other agents, ?amlodipine  FEN/GI: Heart Healthy; SLIV  Prophylaxis: Heparin subQ  Disposition: Admit to observation for MI rule-out; Attending Dr Ree Kida; Discharge pending HTN improvement and Cardiac work-up  History of Present Illness: [name] is a 63 y.o. female presenting with chest pain "heaviness" radiating to both arms this morning at church associated with nausea, dizziness and feeling "hot - like I was about to pass-out" brought about by an argument. Denies SOB.  She also endorses transient left hand weakness later in the day "dropped a pot" that completely resolved in a couple hours. Reports mild LE edema x 1 month.   Review Of Systems: Per HPI with the following additions: no fever / chills, abd pain, change in bowel/bladder, or coryza-type symptoms Otherwise 12 point review of systems was performed and was unremarkable.  Patient Active Problem List   Diagnosis Date Noted  . Chest pain 01/10/2014  . VENTRICULAR HYPERTROPHY, LEFT 05/30/2009  . HYPERCHOLESTEROLEMIA 02/20/2007  . HYPERTENSION, BENIGN SYSTEMIC 02/20/2007  . HEARTBURN 02/20/2007   Past Medical History: Past Medical History  Diagnosis Date  . Hypertension    Past Surgical History: History reviewed. No pertinent past surgical history. Social History: History  Substance Use Topics  . Smoking status: Former Smoker -- 5 years    Quit date: 08/16/1989  . Smokeless tobacco: Never Used  . Alcohol Use: No   Additional social history:   Please also refer to relevant sections of EMR.  Family History: No family history on file. Allergies and Medications: No Known Allergies No current facility-administered medications on file prior to encounter.   No current outpatient prescriptions on file prior to encounter.    Objective: BP 177/95  Pulse 58  Temp(Src) 97.8 F (36.6 C) (Oral)  Resp 19  Ht 5\' 2"  (1.575 m)  Wt 165 lb (74.844 kg)  BMI 30.17 kg/m2  SpO2 99%  Exam: General: Elderly Female; NAD HEENT: MMM, [place]/AT, EOMI Neuro: A&Ox3; PERRL; EOMI; Gross Sensory & Motor intact; Cerebellar fcn intact  Normal finger-to-nose, no pronator drift  Cardiovascular: RRR no m/r/g; UE&LE pulses 2+; BP equal b/l Respiratory: CTAB, normal effort Abdomen: Soft NT/ND Extremities: No LE edema, otherwise warm / well-perfused  Labs and Imaging: CBC BMET   Recent Labs Lab 01/10/14 1640  WBC 6.4  HGB 13.7  HCT 37.5  PLT 188    Recent Labs Lab 01/10/14 1640  NA 144  K 3.9  CL  102  CO2 26  BUN 19  CREATININE 0.75  GLUCOSE 106*  CALCIUM 9.7     Troponin (Point of Care Test)  Recent Labs  01/10/14 1700  TROPIPOC 0.01   BNP    Component Value Date/Time   PROBNP 97.6 01/10/2014 1817   Phill Myron, MD 01/10/2014, 8:18 PM PGY-1, [place] Intern pager: [phone], text pages welcome  FPTS Upper-Level Resident Addendum  I have independently interviewed and examined the patient. I have discussed the above with Dr. Berkley Harvey and agree with his documentation as above. The above reflects his original note with my edits for correction/additions/clarification in orange. Please see also any attending notes.   Emmaline Kluver, MD PGY-2, [place] Service pager: [phone] (text pages welcome through [facility])

## 2014-01-10 NOTE — ED Notes (Signed)
Pt presents to department for evaluation of midsternal chest pain radiating to both arms. Onset during church this afternoon. States she got angry at church and became very upset, pain started after this incident. 8/10 pain upon arrival. Also reports tingling to both arms. Respirations unlabored. Skin warm and dry. Pt is conscious alert and oriented x4.

## 2014-01-10 NOTE — ED Provider Notes (Signed)
CSN: [id number]     Arrival date & time 01/10/14  1623 History   First MD Initiated Contact with Patient 01/10/14 1751     Chief Complaint  Patient presents with  . Chest Pain   (Consider location/radiation/quality/duration/timing/severity/associated sxs/prior Treatment) HPI Comments: Patient developed midsternal chest pain while having an argument church. It radiated to her arms.  it lasted about one hour it became better with rest. He denies any nausea, vomiting, abdominal pain. She reports near syncope the family she's going to pass out. He said similar pain in the past but not for several months. She sees Dr. Tamala Julian of cardiology. She reports a negative catheterization 4 years ago. Denies any history of MI or stents. Pain is better than it was but she still has some pressure in her chest.  The history is provided by the patient.    Past Medical History  Diagnosis Date  . Hypertension    History reviewed. No pertinent past surgical history. History reviewed. No pertinent family history. History  Substance Use Topics  . Smoking status: Former Smoker -- 5 years    Quit date: 08/16/1989  . Smokeless tobacco: Never Used  . Alcohol Use: No   OB History   Grav Para Term Preterm Abortions TAB SAB Ect Mult Living                 Review of Systems  Constitutional: Negative for fever, activity change and appetite change.  Respiratory: Positive for chest tightness and shortness of breath. Negative for cough.   Cardiovascular: Positive for chest pain.  Gastrointestinal: Negative for nausea, vomiting and abdominal pain.  Genitourinary: Negative for dysuria and hematuria.  Musculoskeletal: Positive for arthralgias and myalgias. Negative for back pain.  Skin: Negative for rash.  Neurological: Positive for weakness. Negative for dizziness and headaches.  A complete 10 system review of systems was obtained and all systems are negative except as noted in the HPI and PMH.    Allergies   Review of patient's allergies indicates no known allergies.  Home Medications   No current outpatient prescriptions on file. BP 174/74  Pulse 58  Temp(Src) 98.4 F (36.9 C) (Oral)  Resp 15  Ht 5\' 2"  (1.575 m)  Wt 165 lb (74.844 kg)  BMI 30.17 kg/m2  SpO2 98% Physical Exam  Constitutional: She is oriented to person, place, and time. She appears well-developed and well-nourished. No distress.  HENT:  Head: Normocephalic and atraumatic.  Mouth/Throat: Oropharynx is clear and moist. No oropharyngeal exudate.  Eyes: Conjunctivae and EOM are normal. Pupils are equal, round, and reactive to light. Right eye exhibits no discharge.  Neck: Normal range of motion. Neck supple.  Cardiovascular: Normal rate, regular rhythm, normal heart sounds and intact distal pulses.   No murmur heard. Pulmonary/Chest: Effort normal and breath sounds normal. No respiratory distress. She exhibits tenderness.  Abdominal: Soft. There is no tenderness. There is no rebound and no guarding.  Musculoskeletal: Normal range of motion. She exhibits no edema and no tenderness.  Neurological: She is alert and oriented to person, place, and time. No cranial nerve deficit. She exhibits normal muscle tone. Coordination normal.  CN 2-12 intact, no ataxia on finger to nose, no nystagmus, 5/5 strength throughout, no pronator drift, Romberg negative, normal gait.   Skin: Skin is warm.    ED Course  Procedures (including critical care time) Labs Review Labs Reviewed  CBC WITH DIFFERENTIAL - Abnormal; Notable for the following:    MCHC 36.5 (*)  All other components within normal limits  BASIC METABOLIC PANEL - Abnormal; Notable for the following:    Glucose, Bld 106 (*)    GFR calc non Af Amer 89 (*)    All other components within normal limits  PRO B NATRIURETIC PEPTIDE  TROPONIN I  TROPONIN I  LIPID PANEL  TROPONIN I  TROPONIN I  HEMOGLOBIN A1C  TSH  POCT I-STAT TROPONIN I   Imaging Review Dg Chest 2  View  01/10/2014   CLINICAL DATA:  Chest pain extending into both lungs  EXAM: CHEST  2 VIEW  COMPARISON:  PA and lateral chest x-ray dated April 07, 2013.  FINDINGS: The lungs are adequately inflated and clear. There is stable upper retrosternal calcific density on the lateral film that likely reflects previous granulomatous infection. The cardiac silhouette is normal in size. The pulmonary vascularity is not engorged. The mediastinum is normal in width. There is mild tortuosity of the descending thoracic aorta. There is no pleural effusion or pneumothorax. The observed portions of the bony thorax appear normal.  IMPRESSION: 1. There is no evidence of CHF. 2. The interstitial markings of most both lungs are mildly increased which may reflect the patient's smoking history. There is no evidence of pneumonia.   Electronically Signed   By: David  Martinique   On: 01/10/2014 17:22   Ct Head Wo Contrast  01/11/2014   CLINICAL DATA:  Decreased coordination.  Elevated blood pressure.  EXAM: CT HEAD WITHOUT CONTRAST  TECHNIQUE: Contiguous axial images were obtained from the base of the skull through the vertex without intravenous contrast.  COMPARISON:  None.  FINDINGS: Sinuses/Soft tissues: Mucous retention cysts or polyps in the right maxillary sinus. Hypoplastic frontal sinuses. Clear mastoid air cells.  Intracranial: No mass lesion, hemorrhage, hydrocephalus, acute infarct, intra-axial, or extra-axial fluid collection.  IMPRESSION: 1.  No acute intracranial abnormality. 2. Sinus disease.   Electronically Signed   By: Abigail Miyamoto M.D.   On: 01/11/2014 00:27   Ct Angio Chest Aortic Dissect W &/or W/o  01/11/2014   CLINICAL DATA:  Chest pain and back pain.  Hypertension.  EXAM: CT ANGIOGRAPHY CHEST, ABDOMEN AND PELVIS  TECHNIQUE: Multidetector CT imaging through the chest, abdomen and pelvis was performed using the standard protocol during bolus administration of intravenous contrast. Multiplanar reconstructed images  including MIPs were obtained and reviewed to evaluate the vascular anatomy.  CONTRAST:  178mL OMNIPAQUE IOHEXOL 350 MG/ML SOLN  COMPARISON:  CT ABD-PEL WO/W CM dated 12/23/2012  FINDINGS: CTA CHEST FINDINGS  There is no evidence of aneurysmal dilatation. No aortic dissection is seen. No calcific atherosclerotic disease is noted along the thoracic aorta. Mild calcific atherosclerotic disease is seen along the proximal great vessels. The proximal great vessels are otherwise unremarkable in appearance.  There is no evidence of pulmonary embolus.  The lungs are essentially clear bilaterally. There is no evidence of significant focal consolidation, pleural effusion or pneumothorax. No masses are identified; no abnormal focal contrast enhancement is seen.  The mediastinum is unremarkable in appearance. No mediastinal lymphadenopathy is seen. No pericardial effusion is identified. No axillary lymphadenopathy is seen. The thyroid gland is unremarkable in appearance.  No acute osseous abnormalities are seen.  Review of the MIP images confirms the above findings.  CTA ABDOMEN AND PELVIS FINDINGS  There is no evidence of aneurysmal dilatation or aortic dissection along the abdominal aorta. The celiac trunk, superior mesenteric artery, bilateral renal arteries and inferior mesenteric artery remain patent. Mild calcific atherosclerotic disease  is noted along the distal abdominal aorta and along the branches of the abdominal aorta. The inferior vena cava is grossly unremarkable in appearance.  The liver and spleen are unremarkable in appearance. The gallbladder is within normal limits. The pancreas and adrenal glands are unremarkable.  Scattered small bilateral renal cysts are seen, measuring up to 1.5 cm in size. The kidneys are otherwise unremarkable in appearance. There is no evidence of hydronephrosis. No renal or ureteral stones are seen. No perinephric stranding is appreciated.  No free fluid is identified. The small bowel  is unremarkable in appearance. The stomach is within normal limits. No acute vascular abnormalities are seen.  The patient is status post appendectomy. The colon is unremarkable in appearance.  The bladder is mildly distended and grossly unremarkable. The patient is status post hysterectomy. No suspicious adnexal masses are seen. No inguinal lymphadenopathy is seen. Minimal air within the anterior abdominal wall at the right lower quadrant likely reflects a recent injection site.  No acute osseous abnormalities are identified.  Review of the MIP images confirms the above findings.  IMPRESSION: 1. No evidence of aortic dissection or aneurysm dilatation along the thoracic or abdominal aorta. Minimal calcific atherosclerotic disease along the distal abdominal aorta and along the branches of the abdominal aorta, and also at the proximal great vessels. No evidence of luminal narrowing. 2. No evidence of pulmonary embolus. 3. Lungs clear bilaterally. 4. Small bilateral renal cysts seen. 5. No acute abnormality seen within the abdomen or pelvis.   Electronically Signed   By: Garald Balding M.D.   On: 01/11/2014 00:35   Ct Angio Abd/pel W/ And/or W/o  01/11/2014   CLINICAL DATA:  Chest pain and back pain.  Hypertension.  EXAM: CT ANGIOGRAPHY CHEST, ABDOMEN AND PELVIS  TECHNIQUE: Multidetector CT imaging through the chest, abdomen and pelvis was performed using the standard protocol during bolus administration of intravenous contrast. Multiplanar reconstructed images including MIPs were obtained and reviewed to evaluate the vascular anatomy.  CONTRAST:  19mL OMNIPAQUE IOHEXOL 350 MG/ML SOLN  COMPARISON:  CT ABD-PEL WO/W CM dated 12/23/2012  FINDINGS: CTA CHEST FINDINGS  There is no evidence of aneurysmal dilatation. No aortic dissection is seen. No calcific atherosclerotic disease is noted along the thoracic aorta. Mild calcific atherosclerotic disease is seen along the proximal great vessels. The proximal great vessels  are otherwise unremarkable in appearance.  There is no evidence of pulmonary embolus.  The lungs are essentially clear bilaterally. There is no evidence of significant focal consolidation, pleural effusion or pneumothorax. No masses are identified; no abnormal focal contrast enhancement is seen.  The mediastinum is unremarkable in appearance. No mediastinal lymphadenopathy is seen. No pericardial effusion is identified. No axillary lymphadenopathy is seen. The thyroid gland is unremarkable in appearance.  No acute osseous abnormalities are seen.  Review of the MIP images confirms the above findings.  CTA ABDOMEN AND PELVIS FINDINGS  There is no evidence of aneurysmal dilatation or aortic dissection along the abdominal aorta. The celiac trunk, superior mesenteric artery, bilateral renal arteries and inferior mesenteric artery remain patent. Mild calcific atherosclerotic disease is noted along the distal abdominal aorta and along the branches of the abdominal aorta. The inferior vena cava is grossly unremarkable in appearance.  The liver and spleen are unremarkable in appearance. The gallbladder is within normal limits. The pancreas and adrenal glands are unremarkable.  Scattered small bilateral renal cysts are seen, measuring up to 1.5 cm in size. The kidneys are otherwise unremarkable in appearance.  There is no evidence of hydronephrosis. No renal or ureteral stones are seen. No perinephric stranding is appreciated.  No free fluid is identified. The small bowel is unremarkable in appearance. The stomach is within normal limits. No acute vascular abnormalities are seen.  The patient is status post appendectomy. The colon is unremarkable in appearance.  The bladder is mildly distended and grossly unremarkable. The patient is status post hysterectomy. No suspicious adnexal masses are seen. No inguinal lymphadenopathy is seen. Minimal air within the anterior abdominal wall at the right lower quadrant likely reflects a  recent injection site.  No acute osseous abnormalities are identified.  Review of the MIP images confirms the above findings.  IMPRESSION: 1. No evidence of aortic dissection or aneurysm dilatation along the thoracic or abdominal aorta. Minimal calcific atherosclerotic disease along the distal abdominal aorta and along the branches of the abdominal aorta, and also at the proximal great vessels. No evidence of luminal narrowing. 2. No evidence of pulmonary embolus. 3. Lungs clear bilaterally. 4. Small bilateral renal cysts seen. 5. No acute abnormality seen within the abdomen or pelvis.   Electronically Signed   By: Garald Balding M.D.   On: 01/11/2014 00:35    EKG Interpretation    Date/Time:  Sunday January 10 2014 16:32:02 EST Ventricular Rate:  76 PR Interval:  120 QRS Duration: 100 QT Interval:  396 QTC Calculation: 445 R Axis:   -18 Text Interpretation:  Normal sinus rhythm Left ventricular hypertrophy with repolarization abnormality Abnormal ECG lateral T wave inversions Confirmed by Wyvonnia Dusky  MD, Ruthe Roemer ([id number]) on 01/10/2014 6:14:53 PM            MDM   1. Chest pain   2. EKG abnormality   3. Essential hypertension, benign   4. Hypertensive urgency   5. Pure hypercholesterolemia    Episode of substernal chest pain radiating to both arms, resolving after one hour.  Endorses some chest soreness at this time but not like what she had earlier.  EKG shows ST depressions and T-wave inversions laterally that are worse from previous. Consistent with LVH likely.  Patient is hypertensive but equal blood pressures bilaterally. No neurological deficits. No evidence of PE or dissection.  Troponin is negative. Given patient's description as well as her EKG findings, she'll be admitted for further evaluation for chest pain. Discussed with cardiology who prefers medical admission.      Ezequiel Essex, MD 01/11/14 [phone]

## 2014-01-11 DIAGNOSIS — R079 Chest pain, unspecified: Secondary | ICD-10-CM

## 2014-01-11 DIAGNOSIS — I517 Cardiomegaly: Secondary | ICD-10-CM

## 2014-01-11 LAB — LIPID PANEL
CHOLESTEROL: 165 mg/dL (ref 0–200)
HDL: 38 mg/dL — ABNORMAL LOW (ref >39)
LDL Cholesterol: 116 mg/dL — ABNORMAL HIGH (ref 0–99)
Total CHOL/HDL Ratio: 4.3 RATIO
Triglycerides: 55 mg/dL (ref <150)
VLDL: 11 mg/dL (ref 0–40)

## 2014-01-11 LAB — TROPONIN I: Troponin I: <0.3 ng/mL (ref <0.30)

## 2014-01-11 LAB — HEMOGLOBIN A1C
HEMOGLOBIN A1C: 5.3 % (ref <5.7)
Mean Plasma Glucose: 105 mg/dL (ref <117)

## 2014-01-11 LAB — TSH: TSH: 1.81 u[IU]/mL (ref 0.350–4.500)

## 2014-01-11 MED ORDER — ATORVASTATIN CALCIUM 40 MG PO TABS: 40.0000 mg | ORAL_TABLET | Freq: Every day | ORAL | Status: DC

## 2014-01-11 MED ORDER — INFLUENZA VAC SPLIT QUAD 0.5 ML IM SUSP
0.5000 mL | INTRAMUSCULAR | Status: AC
  Administered 2014-01-11: 0.5 mL via INTRAMUSCULAR
  Filled 2014-01-11: qty 0.5

## 2014-01-11 MED ORDER — ASPIRIN EC 81 MG PO TBEC: 81.0000 mg | DELAYED_RELEASE_TABLET | Freq: Every day | ORAL | Status: DC

## 2014-01-11 MED ORDER — ATORVASTATIN CALCIUM 40 MG PO TABS
40.0000 mg | ORAL_TABLET | Freq: Every day | ORAL | Status: DC
  Administered 2014-01-11: 40 mg via ORAL
  Filled 2014-01-11: qty 1

## 2014-01-11 NOTE — Discharge Summary (Signed)
[facility] Discharge Summary  Patient name: [name] Medical record number: [id number] Date of birth: 07-09-1951 Age: 63 y.o. Gender: female Date of Admission: 01/10/2014  Date of Discharge: 01/11/14 Admitting Physician: Lupita Dawn, MD  Primary Care Provider: Zigmund Gottron, MD Consultants: Cardiology  Indication for Hospitalization: Hypertensive urgency & chest pain  Discharge Diagnoses/Problem List:   HTN urgency  Unilateral weakness  Disposition: Home  Discharge Condition: Stable  Brief Hospital Course: [name] is a 63 y.o. female who presented with HTN urgency and chest pain. PMH is significant for HTN. Chest pain seemed likely related to emotional disturbance with argument at church, but pt with significant risk factors. Her EKG showed NSR w/ Twave inversions: 2,3 aVF, V4-6 which were unchanged from previous and her Troponins were Negative x 3. Her HTN resolved with reinitiating her home medications. An ECHO showed moderate LVH with EF 60-65%. Risk labs: A1c 5.3, TSH wnl; LDL 116. Lipitor 40 mg qhs and ASA 81 mg were started for primary prevention and CV risk ~ 20%.   Issues for Follow Up:   Continue statin and ASA  Significant Procedures: none  Significant Labs and Imaging:   Recent Labs Lab 01/10/14 1640  WBC 6.4  HGB 13.7  HCT 37.5  PLT 188    Recent Labs Lab 01/10/14 1640  NA 144  K 3.9  CL 102  CO2 26  GLUCOSE 106*  BUN 19  CREATININE 0.75  CALCIUM 9.7   Results/Tests Pending at Time of Discharge: None  Discharge Medications:    Medication List         aspirin EC 81 MG tablet  Take 1 tablet (81 mg total) by mouth daily.     atorvastatin 40 MG tablet  Commonly known as:  LIPITOR  Take 1 tablet (40 mg total) by mouth daily at 6 PM.     lisinopril-hydrochlorothiazide 20-12.5 MG per tablet  Commonly known as:  PRINZIDE,ZESTORETIC  Take 1 tablet by mouth daily.        Discharge  Instructions: Please refer to Patient Instructions section of EMR for full details.  Patient was counseled important signs and symptoms that should prompt return to medical care, changes in medications, dietary instructions, activity restrictions, and follow up appointments.   Follow-Up Appointments: Follow-up Information   Follow up with Sinclair Grooms, MD. (01/15/14 at 2:15pm)    Specialty:  Cardiology   Contact information:   [address] [place] [address] [phone]       Follow up with Zigmund Gottron, MD On 01/20/2014. (Apt @ 3pm )    Specialty:  Family Medicine   Contact information:   [place] [address] [phone]       Phill Myron, MD 01/13/2014, 5:05 PM PGY-1, [place]

## 2014-01-11 NOTE — Progress Notes (Addendum)
Patient ID: [name], female   DOB: 1951/04/24, 63 y.o.   MRN: [id number]    Subjective:  Denies SSCP, palpitations or Dyspnea Chest pain ppt by argument at church that got her very upset   Objective:  Filed Vitals:   01/11/14 0000 01/11/14 0400 01/11/14 0748 01/11/14 0752  BP: 155/72 136/64 174/77 142/67  Pulse: 53 46 55 72  Temp: 97.9 F (36.6 C) 98.3 F (36.8 C) 97.8 F (36.6 C)   TempSrc: Oral  Oral   Resp: 15 15 15 16   Height:      Weight:      SpO2: 96% 98% 96% 100%    Intake/Output from previous day: No intake or output data in the 24 hours ending 01/11/14 0906  Physical Exam: Affect appropriate Healthy:  appears stated age HEENT: normal Neck supple with no adenopathy JVP normal no bruits no thyromegaly Lungs clear with no wheezing and good diaphragmatic motion Heart:  S1/S2 SEM murmur, no rub, gallop or click PMI normal Abdomen: benighn, BS positve, no tenderness, no AAA no bruit.  No HSM or HJR Distal pulses intact with no bruits No edema Neuro non-focal Skin warm and dry No muscular weakness   Lab Results: Basic Metabolic Panel:  Recent Labs  01/10/14 1640  NA 144  K 3.9  CL 102  CO2 26  GLUCOSE 106*  BUN 19  CREATININE 0.75  CALCIUM 9.7   CBC:  Recent Labs  01/10/14 1640  WBC 6.4  NEUTROABS 4.4  HGB 13.7  HCT 37.5  MCV 85.8  PLT 188   Cardiac Enzymes:  Recent Labs  01/10/14 1817 01/10/14 2245 01/11/14 0250  TROPONINI <0.30 <0.30 <0.30   Fasting Lipid Panel:  Recent Labs  01/11/14 0250  CHOL 165  HDL 38*  LDLCALC 116*  TRIG 55  CHOLHDL 4.3    Imaging: Dg Chest 2 View  01/10/2014   CLINICAL DATA:  Chest pain extending into both lungs  EXAM: CHEST  2 VIEW  COMPARISON:  PA and lateral chest x-ray dated April 07, 2013.  FINDINGS: The lungs are adequately inflated and clear. There is stable upper retrosternal calcific density on the lateral film that likely reflects previous granulomatous infection. The cardiac  silhouette is normal in size. The pulmonary vascularity is not engorged. The mediastinum is normal in width. There is mild tortuosity of the descending thoracic aorta. There is no pleural effusion or pneumothorax. The observed portions of the bony thorax appear normal.  IMPRESSION: 1. There is no evidence of CHF. 2. The interstitial markings of most both lungs are mildly increased which may reflect the patient's smoking history. There is no evidence of pneumonia.   Electronically Signed   By: David  Martinique   On: 01/10/2014 17:22   Ct Head Wo Contrast  01/11/2014   CLINICAL DATA:  Decreased coordination.  Elevated blood pressure.  EXAM: CT HEAD WITHOUT CONTRAST  TECHNIQUE: Contiguous axial images were obtained from the base of the skull through the vertex without intravenous contrast.  COMPARISON:  None.  FINDINGS: Sinuses/Soft tissues: Mucous retention cysts or polyps in the right maxillary sinus. Hypoplastic frontal sinuses. Clear mastoid air cells.  Intracranial: No mass lesion, hemorrhage, hydrocephalus, acute infarct, intra-axial, or extra-axial fluid collection.  IMPRESSION: 1.  No acute intracranial abnormality. 2. Sinus disease.   Electronically Signed   By: Abigail Miyamoto M.D.   On: 01/11/2014 00:27   Ct Angio Chest Aortic Dissect W &/or W/o  01/11/2014   CLINICAL DATA:  Chest  pain and back pain.  Hypertension.  EXAM: CT ANGIOGRAPHY CHEST, ABDOMEN AND PELVIS  TECHNIQUE: Multidetector CT imaging through the chest, abdomen and pelvis was performed using the standard protocol during bolus administration of intravenous contrast. Multiplanar reconstructed images including MIPs were obtained and reviewed to evaluate the vascular anatomy.  CONTRAST:  168mL OMNIPAQUE IOHEXOL 350 MG/ML SOLN  COMPARISON:  CT ABD-PEL WO/W CM dated 12/23/2012  FINDINGS: CTA CHEST FINDINGS  There is no evidence of aneurysmal dilatation. No aortic dissection is seen. No calcific atherosclerotic disease is noted along the thoracic  aorta. Mild calcific atherosclerotic disease is seen along the proximal great vessels. The proximal great vessels are otherwise unremarkable in appearance.  There is no evidence of pulmonary embolus.  The lungs are essentially clear bilaterally. There is no evidence of significant focal consolidation, pleural effusion or pneumothorax. No masses are identified; no abnormal focal contrast enhancement is seen.  The mediastinum is unremarkable in appearance. No mediastinal lymphadenopathy is seen. No pericardial effusion is identified. No axillary lymphadenopathy is seen. The thyroid gland is unremarkable in appearance.  No acute osseous abnormalities are seen.  Review of the MIP images confirms the above findings.  CTA ABDOMEN AND PELVIS FINDINGS  There is no evidence of aneurysmal dilatation or aortic dissection along the abdominal aorta. The celiac trunk, superior mesenteric artery, bilateral renal arteries and inferior mesenteric artery remain patent. Mild calcific atherosclerotic disease is noted along the distal abdominal aorta and along the branches of the abdominal aorta. The inferior vena cava is grossly unremarkable in appearance.  The liver and spleen are unremarkable in appearance. The gallbladder is within normal limits. The pancreas and adrenal glands are unremarkable.  Scattered small bilateral renal cysts are seen, measuring up to 1.5 cm in size. The kidneys are otherwise unremarkable in appearance. There is no evidence of hydronephrosis. No renal or ureteral stones are seen. No perinephric stranding is appreciated.  No free fluid is identified. The small bowel is unremarkable in appearance. The stomach is within normal limits. No acute vascular abnormalities are seen.  The patient is status post appendectomy. The colon is unremarkable in appearance.  The bladder is mildly distended and grossly unremarkable. The patient is status post hysterectomy. No suspicious adnexal masses are seen. No inguinal  lymphadenopathy is seen. Minimal air within the anterior abdominal wall at the right lower quadrant likely reflects a recent injection site.  No acute osseous abnormalities are identified.  Review of the MIP images confirms the above findings.  IMPRESSION: 1. No evidence of aortic dissection or aneurysm dilatation along the thoracic or abdominal aorta. Minimal calcific atherosclerotic disease along the distal abdominal aorta and along the branches of the abdominal aorta, and also at the proximal great vessels. No evidence of luminal narrowing. 2. No evidence of pulmonary embolus. 3. Lungs clear bilaterally. 4. Small bilateral renal cysts seen. 5. No acute abnormality seen within the abdomen or pelvis.   Electronically Signed   By: Garald Balding M.D.   On: 01/11/2014 00:35   Ct Angio Abd/pel W/ And/or W/o  01/11/2014   CLINICAL DATA:  Chest pain and back pain.  Hypertension.  EXAM: CT ANGIOGRAPHY CHEST, ABDOMEN AND PELVIS  TECHNIQUE: Multidetector CT imaging through the chest, abdomen and pelvis was performed using the standard protocol during bolus administration of intravenous contrast. Multiplanar reconstructed images including MIPs were obtained and reviewed to evaluate the vascular anatomy.  CONTRAST:  177mL OMNIPAQUE IOHEXOL 350 MG/ML SOLN  COMPARISON:  CT ABD-PEL WO/W CM dated  12/23/2012  FINDINGS: CTA CHEST FINDINGS  There is no evidence of aneurysmal dilatation. No aortic dissection is seen. No calcific atherosclerotic disease is noted along the thoracic aorta. Mild calcific atherosclerotic disease is seen along the proximal great vessels. The proximal great vessels are otherwise unremarkable in appearance.  There is no evidence of pulmonary embolus.  The lungs are essentially clear bilaterally. There is no evidence of significant focal consolidation, pleural effusion or pneumothorax. No masses are identified; no abnormal focal contrast enhancement is seen.  The mediastinum is unremarkable in appearance.  No mediastinal lymphadenopathy is seen. No pericardial effusion is identified. No axillary lymphadenopathy is seen. The thyroid gland is unremarkable in appearance.  No acute osseous abnormalities are seen.  Review of the MIP images confirms the above findings.  CTA ABDOMEN AND PELVIS FINDINGS  There is no evidence of aneurysmal dilatation or aortic dissection along the abdominal aorta. The celiac trunk, superior mesenteric artery, bilateral renal arteries and inferior mesenteric artery remain patent. Mild calcific atherosclerotic disease is noted along the distal abdominal aorta and along the branches of the abdominal aorta. The inferior vena cava is grossly unremarkable in appearance.  The liver and spleen are unremarkable in appearance. The gallbladder is within normal limits. The pancreas and adrenal glands are unremarkable.  Scattered small bilateral renal cysts are seen, measuring up to 1.5 cm in size. The kidneys are otherwise unremarkable in appearance. There is no evidence of hydronephrosis. No renal or ureteral stones are seen. No perinephric stranding is appreciated.  No free fluid is identified. The small bowel is unremarkable in appearance. The stomach is within normal limits. No acute vascular abnormalities are seen.  The patient is status post appendectomy. The colon is unremarkable in appearance.  The bladder is mildly distended and grossly unremarkable. The patient is status post hysterectomy. No suspicious adnexal masses are seen. No inguinal lymphadenopathy is seen. Minimal air within the anterior abdominal wall at the right lower quadrant likely reflects a recent injection site.  No acute osseous abnormalities are identified.  Review of the MIP images confirms the above findings.  IMPRESSION: 1. No evidence of aortic dissection or aneurysm dilatation along the thoracic or abdominal aorta. Minimal calcific atherosclerotic disease along the distal abdominal aorta and along the branches of the  abdominal aorta, and also at the proximal great vessels. No evidence of luminal narrowing. 2. No evidence of pulmonary embolus. 3. Lungs clear bilaterally. 4. Small bilateral renal cysts seen. 5. No acute abnormality seen within the abdomen or pelvis.   Electronically Signed   By: Garald Balding M.D.   On: 01/11/2014 00:35    Cardiac Studies:  ECG:  SR anterolateral T wave changes chronic no change from 2010    Telemetry:  NSR no VT or afib   Echo:  pending  Medications:   . aspirin EC  325 mg Oral Daily  . atorvastatin  40 mg Oral q1800  . heparin  5,000 Units Subcutaneous Q8H  . lisinopril  20 mg Oral Daily   And  . hydrochlorothiazide  12.5 mg Oral Daily       Assessment/Plan:  Chest Pain:  Unfortunately CT scan done without gating  Cardiac CT could have r/o disection and CAD  She has mild calcification of the proximal and mid LAD and circumflex  ECG with biphasic anterolateral  T waves which are chronic Echo ordered.  F/U with Dr Waldron Labs made for 2:15pm this Friday    Can d/c or wait for  echo.  Doubt HOCM but likely has some LVH   Chol:  Continue statin  HTN:  cotinue ACE   Jenkins Rouge 01/11/2014, 9:06 AM

## 2014-01-11 NOTE — Progress Notes (Signed)
Family Medicine Teaching Service Daily Progress Note Intern Pager: [phone]  Patient name: [name] Medical record number: [id number] Date of birth: 10-Oct-1951 Age: 63 y.o. Gender: female  Primary Care Provider: Zigmund Gottron, MD Consultants: Cardiology Code Status: Full  Pt Overview and Major Events to Date:  1/18:  HTN urgerncy; MI-ruleout;   Assessment and Plan: [name] is a 63 y.o. female presenting with chest pain. PMH is significant for HTN. Chest pain seems likely related to emotional disturbance with argument at church, but pt with significant risk factors.   # Chest Pain  - Nature of chest pain and HTN urgency concerning for ACS. FHx of MI Mother (4) & Sister (26). Previously evaluated by [facility] Dr Tamala Julian w/ Stress test (06/2009 in media tab): EF 58%. BNP 97 on admit  - EKG (admit): NSR w/ Twave inversions: 2,3 aVF, V4-6 which are unchanged from previous  - Repeat EKG: Sinus Bradycardia, LAD & LVH, w/ Twave inversions: 2,3 aVF, V4-6 which are unchanged  - Trops Neg x 3 - CT chest/ab/pelvis Angio: Neg for aortic dissection / coarctation  - Risk Stratification labs: TSH, A1c & ECHO: pending   -  LDL 116 (1/19) CV risk 10-20%: Start Lipitor 40 mg qhs - ASA 325  - Morphine for pain control   # Transient Weakness  - Neuro exam normal on admission; Unlikely TIA but given risk factors, HTN, smoking hx, and HTN urgency will obtain CT head and consider further work-up pending results  - CT Head: Neg for acute pathology   # HTN Urgency: Resolved  - BP last: 136/64 mmHg - Continue lisinopril 20 mg qd & HCTZ 12.5 mg qd   FEN/GI: Heart Healthy; NPO until Cardiology evaluates   Prophylaxis: Heparin subQ   Disposition: Admit to observation for MI rule-out; Attending Dr Ree Kida; Discharge pending HTN improvement and Cardiac work-up  Subjective: Mild HA this morning that has resolved. Otherwise her chest "heaviness" in much improved but still present.  Denies SOB.   Objective: Temp:  [97.8 F (36.6 C)-98.4 F (36.9 C)] 98.3 F (36.8 C) (01/19 0400) Pulse Rate:  [46-81] 46 (01/19 0400) Resp:  [15-24] 15 (01/19 0400) BP: (136-207)/(64-95) 136/64 mmHg (01/19 0400) SpO2:  [96 %-99 %] 98 % (01/19 0400) Weight:  [165 lb (74.844 kg)] 165 lb (74.844 kg) (01/18 1626) Physical Exam: General: Elderly Female; NAD  HEENT: MMM Cardiovascular: RRR no m/r/g; pulses 2+ Respiratory: CTAB, normal effort  Extremities: No LE edema, otherwise warm / well-perfused  Laboratory:  Recent Labs Lab 01/10/14 1640  WBC 6.4  HGB 13.7  HCT 37.5  PLT 188    Recent Labs Lab 01/10/14 1640  NA 144  K 3.9  CL 102  CO2 26  BUN 19  CREATININE 0.75  CALCIUM 9.7  GLUCOSE 106*     Recent Labs Lab 01/10/14 1817 01/10/14 2245 01/11/14 0250  TROPONINI <0.30 <0.30 <0.30   BNP    Component Value Date/Time   PROBNP 97.6 01/10/2014 1817   Imaging/Diagnostic Tests: CT Angio Chest Abd/Pelvis 1/18 IMPRESSION:  1. No evidence of aortic dissection or aneurysm dilatation along the  thoracic or abdominal aorta. Minimal calcific atherosclerotic  disease along the distal abdominal aorta and along the branches of  the abdominal aorta, and also at the proximal great vessels. No  evidence of luminal narrowing.  2. No evidence of pulmonary embolus.  3. Lungs clear bilaterally.  4. Small bilateral renal cysts seen.  5. No acute abnormality seen within the abdomen  or pelvis  CT Head 1/18 Sinuses/Soft tissues: Mucous retention cysts or polyps in the right  maxillary sinus. Hypoplastic frontal sinuses. Clear mastoid air  cells.  Intracranial: No mass lesion, hemorrhage, hydrocephalus, acute  infarct, intra-axial, or extra-axial fluid collection.   IMPRESSION:  1. No acute intracranial abnormality.  2. Sinus disease.  CXR 1/18 IMPRESSION:  1. There is no evidence of CHF.  2. The interstitial markings of most both lungs are mildly increased  which  may reflect the patient's smoking history. There is no  evidence of pneumonia.  Phill Myron, MD 01/11/2014, 7:28 AM PGY-1, [place] Intern pager: [phone], text pages welcome

## 2014-01-11 NOTE — Progress Notes (Signed)
Echo Lab  2D Echocardiogram completed.  [place], [place] 01/11/2014 12:21 PM

## 2014-01-11 NOTE — Progress Notes (Signed)
Reviewed discharge instructions with patient and she stated her understanding.  Patient received flu vaccine prior to discharge.  Discharged home via wheelchair with family.  Sanda Linger

## 2014-01-11 NOTE — H&P (Signed)
FMTS Attending Note  I personally saw and evaluated the patient. The plan of care was discussed with the resident team. I agree with the assessment and plan as documented by the resident.   63 year old female with past medical history of hypertension and previous smoker presented with accelerated hypertension, patient states that she got into an argument at church yesterday, at which time she developed an initial sharp sensation in her chest which then transitioned to a heavy sensation, she also reported some bilateral hand numbness and right hand numbness, denies focal weakness, no associated slurred speech, no bladder or bowel incontinence, no loss consciousness, no syncope. Please refer to resident note for further history of present illness. Patient currently has a mild frontal headache, her hand numbness has resolved, she denies any focal areas of weakness, she denies current slurred speech, she does continue to have some mild substernal chest heaviness  Vitals: Reviewed General: Pleasant African American female, no acute distress, lying in hospital bed HEENT: Normocephalic, pupils are equal round and reactive to light, extraocular movements are intact, no scleral icterus, nasal septum midline, moist mucous members, uvula midline, no pharyngeal erythema or exudate noted, neck was supple, no anterior posterior cervical lymphadenopathy Cardiac: Regular rate and rhythm, S1 and S2 present, 2/6 systolic murmur heard best at the left second intercostal space, no heaves, no thrills, no carotid bruits, no JVD Respiratory: Clear to auscultation bilaterally, normal effort Abdomen: Soft, nontender, bowel sounds present, no rebound, no guarding Extremities: No edema, 2+ radial pulses bilaterally, 2 posterior cells pedis pulses bilaterally Neuro: Cranial nerves II through XII are intact, strength was 5 of 5 in all extremities, sensation to light touch was grossly intact in all extremities, pronator drift  negative, patient is able to perform finger to nose bilaterally without dysdiadochokinesia  Reviewed lab work, EKG, and imaging studies. EKG consistent with LVH and likely strain pattern with T wave inversions in V4 through V6 that are unchanged from previous  #1. Accelerated hypertension-blood pressure improved on home regimen of hydrochlorothiazide and lisinopril, no evidence of acute kidney injury, no evidence of acute heart injury, ? Hand numbness due to cerebral ischemia versus anxiety/hyperventilation  #2. Chest pain-ACS workup negative thus far, repeat EKG pending this morning, echocardiogram ordered, physical exam consistent with possible aortic stenosis #3. Bilateral hand numbness- resolved this morning, ? Cerebral ischemia versus due to anxiety/hyperventilation, CT head negative for acute hemorrhagic stroke, I discussed these findings with the upper level resident and the team is to discuss need for further CVA workup during morning rounds  Dossie Arbour M.D.

## 2014-01-11 NOTE — Discharge Instructions (Signed)
Please keep both your up coming appointments with Cardiology and your primary care doctor H[place].  - Started Lipitor 40mg  - take at bedtime    Chest Pain (Nonspecific) Chest pain has many causes. Your pain could be caused by something serious, such as a heart attack or a blood clot in the lungs. It could also be caused by something less serious, such as a chest bruise or a virus. Follow up with your doctor. More lab tests or other studies may be needed to find the cause of your pain. Most of the time, nonspecific chest pain will improve within 2 to 3 days of rest and mild pain medicine. HOME CARE  For chest bruises, you may put ice on the sore area for 15-20 minutes, 03-04 times a day. Do this only if it makes you feel better.  Put ice in a plastic bag.  Place a towel between the skin and the bag.  Rest for the next 2 to 3 days.  Go back to work if the pain improves.  See your doctor if the pain lasts longer than 1 to 2 weeks.  Only take medicine as told by your doctor.  Quit smoking if you smoke. GET HELP RIGHT AWAY IF:   There is more pain or pain that spreads to the arm, neck, jaw, back, or belly (abdomen).  You have shortness of breath.  You cough more than usual or cough up blood.  You have very bad back or belly pain, feel sick to your stomach (nauseous), or throw up (vomit).  You have very bad weakness.  You pass out (faint).  You have a fever. Any of these problems may be serious and may be an emergency. Do not wait to see if the problems will go away. Get medical help right away. Call your local emergency services 911 in [place] Do not drive yourself to the hospital. MAKE SURE YOU:   Understand these instructions.  Will watch this condition.  Will get help right away if you or your child is not doing well or gets worse. Document Released: 05/28/2008 Document Revised: 03/03/2012 Document Reviewed: 05/28/2008 [facility] Patient Information 2014 [place], [place].

## 2014-01-11 NOTE — Progress Notes (Signed)
F/u scheduled with Dr. Tamala Julian this Friday 1/23 at 2:15pm. Melina Copa PA-C

## 2014-01-11 NOTE — Progress Notes (Signed)
Seen today by attending, Dr. Ree Kida.  We discussed in rounds.  Agree with Dr. Milagros Reap management and documentation.  Compliance and follow up will be important.

## 2014-01-13 NOTE — Discharge Summary (Signed)
Agree with Dr. Milagros Reap documentation and management and DC of this patient.

## 2014-01-15 ENCOUNTER — Ambulatory Visit (INDEPENDENT_AMBULATORY_CARE_PROVIDER_SITE_OTHER): Payer: BC Managed Care – PPO | Admitting: Interventional Cardiology

## 2014-01-15 ENCOUNTER — Encounter: Payer: Self-pay | Admitting: Interventional Cardiology

## 2014-01-15 VITALS — BP 171/93 | HR 57 | Ht 61.5 in | Wt 158.8 lb

## 2014-01-15 DIAGNOSIS — E7800 Pure hypercholesterolemia, unspecified: Secondary | ICD-10-CM

## 2014-01-15 DIAGNOSIS — R9431 Abnormal electrocardiogram [ECG] [EKG]: Secondary | ICD-10-CM

## 2014-01-15 DIAGNOSIS — R079 Chest pain, unspecified: Secondary | ICD-10-CM

## 2014-01-15 DIAGNOSIS — I10 Essential (primary) hypertension: Secondary | ICD-10-CM

## 2014-01-15 NOTE — Patient Instructions (Signed)
Your physician recommends that you continue on your current medications as directed. Please refer to the Current Medication list given to you today.  Your physician has requested that you have en exercise stress myoview. For further information please visit [email or web address]. Please follow instruction sheet, as given.  Follow up pending stress test results.

## 2014-01-15 NOTE — Progress Notes (Signed)
Patient ID: [name], female   DOB: 1951/07/17, 63 y.o.   MRN: [id number]   Date: 01/15/2014 ID: [name], DOB 07/23/1951, MRN [id number] PCP: Zigmund Gottron, MD  Reason: Chest discomfort  ASSESSMENT;  1. Prolonged chest pain with recent hospitalization. Chest pain was in association with severe blood pressure elevation. Myocardial infarction was ruled out. Since discharge she has had exertional chest tightness and dyspnea.. Widely patent coronaries by angiography 2004 2. Hypertension 3. Hyperlipidemia, untreated 4. Family history of premature atherosclerosis  PLAN:  1. Stress Cardiolite 2. Aspirin 81 mg per day 3. If prolonged chest pain return to emergency room   SUBJECTIVE: [name] is a 63 y.o. female who is referred for evaluation of chest discomfort and abnormal EKG. EKG shows precordial T-wave inversion. She has long-standing history of hypertension. A chest pain episode in 2000 4t coronary angiography which revealed widely patent coronaries. The current situation occurred after the patient got into an argument at church. She began having chest pressure and dyspnea. She came to the emergency room and was noted to have severely elevated blood pressure. The blood pressure was treated with intravenous medications and has it came down the pressure in the chest improved. She ruled out for myocardial infarction with negative markers. There were no evolutionary changes on EKG. Since being discharged from the hospital over the last 4-5 days, she has had exertional dyspnea and chest tightness. Resting relieves the discomfort. She denies orthopnea and PND.   Allergies  Allergen Reactions  . Lipitor [Atorvastatin]     Sick feeling, cramping, swelling    Current Outpatient Prescriptions on File Prior to Visit  Medication Sig Dispense Refill  . aspirin EC 81 MG tablet Take 1 tablet (81 mg total) by mouth daily.  30 tablet  0  . lisinopril-hydrochlorothiazide  (PRINZIDE,ZESTORETIC) 20-12.5 MG per tablet Take 1 tablet by mouth daily.       No current facility-administered medications on file prior to visit.    Past Medical History  Diagnosis Date  . Hypertension     History reviewed. No pertinent past surgical history.  History   Social History  . Marital Status: Divorced    Spouse Name: N/A    Number of Children: N/A  . Years of Education: N/A   Occupational History  . Not on file.   Social History Main Topics  . Smoking status: Former Smoker -- 5 years    Quit date: 08/16/1989  . Smokeless tobacco: Never Used  . Alcohol Use: No  . Drug Use: No  . Sexual Activity: No   Other Topics Concern  . Not on file   Social History Narrative  . No narrative on file    History reviewed. No pertinent family history.  ROS: Denies edema, transient neurological complaints, orthopnea, palpitations, syncope, claudication, abdominal swelling, abdominal pain, nausea vomiting, and reflux.. Other systems negative for complaints.  OBJECTIVE: BP 171/93  Pulse 57  Ht 5' 1.5" (1.562 m)  Wt 158 lb 12.8 oz (72.031 kg)  BMI 29.52 kg/m2,  General: No acute distress, appearing her stated age but with poor dentition HEENT: normal no jaundice or pallor Neck: JVD flat. Carotids 2+ and symmetric Chest: Clear Cardiac: Murmur: 2/6 systolic murmur left lower sternal border. Gallop: S4. Rhythm: Regular . Other: Normal Abdomen: Bruit: Absent. Pulsation: Absent Extremities: Edema: Absent. Pulses: 2+ and symmetric Neuro: Normal Psych: Normal  ECG: Recent EKG during hospital stay demonstrates precordial T-wave inversion extending to the lateral leads. No Q waves noted.

## 2014-01-20 ENCOUNTER — Inpatient Hospital Stay: Payer: BC Managed Care – PPO | Admitting: Family Medicine

## 2014-01-28 ENCOUNTER — Encounter: Payer: Self-pay | Admitting: Cardiology

## 2014-01-28 ENCOUNTER — Ambulatory Visit (HOSPITAL_COMMUNITY): Payer: BC Managed Care – PPO | Attending: Cardiology | Admitting: Radiology

## 2014-01-28 VITALS — BP 239/107 | HR 60 | Ht 62.0 in | Wt 159.0 lb

## 2014-01-28 DIAGNOSIS — R0602 Shortness of breath: Secondary | ICD-10-CM

## 2014-01-28 DIAGNOSIS — R0989 Other specified symptoms and signs involving the circulatory and respiratory systems: Secondary | ICD-10-CM | POA: Insufficient documentation

## 2014-01-28 DIAGNOSIS — R079 Chest pain, unspecified: Secondary | ICD-10-CM

## 2014-01-28 DIAGNOSIS — Z87891 Personal history of nicotine dependence: Secondary | ICD-10-CM | POA: Insufficient documentation

## 2014-01-28 DIAGNOSIS — Z8249 Family history of ischemic heart disease and other diseases of the circulatory system: Secondary | ICD-10-CM | POA: Insufficient documentation

## 2014-01-28 DIAGNOSIS — R0609 Other forms of dyspnea: Secondary | ICD-10-CM | POA: Insufficient documentation

## 2014-01-28 DIAGNOSIS — I10 Essential (primary) hypertension: Secondary | ICD-10-CM | POA: Insufficient documentation

## 2014-01-28 DIAGNOSIS — E785 Hyperlipidemia, unspecified: Secondary | ICD-10-CM | POA: Insufficient documentation

## 2014-01-28 MED ORDER — TECHNETIUM TC 99M SESTAMIBI GENERIC - CARDIOLITE
10.8000 | Freq: Once | INTRAVENOUS | Status: AC | PRN
  Administered 2014-01-28: 11 via INTRAVENOUS

## 2014-01-28 MED ORDER — TECHNETIUM TC 99M SESTAMIBI GENERIC - CARDIOLITE
33.0000 | Freq: Once | INTRAVENOUS | Status: AC | PRN
  Administered 2014-01-28: 33 via INTRAVENOUS

## 2014-01-28 MED ORDER — REGADENOSON 0.4 MG/5ML IV SOLN
0.4000 mg | Freq: Once | INTRAVENOUS | Status: AC
  Administered 2014-01-28: 0.4 mg via INTRAVENOUS

## 2014-01-28 NOTE — Progress Notes (Signed)
[facility] [address] [place], [place] [address] [phone]    Cardiology Nuclear Med Study  Janit Cutter is a 63 y.o. female     MRN : [id number]     DOB: March 28, 1951  Procedure Date: 01/28/2014  Nuclear Med Background Indication for Stress Test:  Evaluation for Ischemia and [facility] 1/15 CP History:  No known CAD, Echo 2015 EF 60-65%, Cardiac CT 2004, 2015 (normal) Cardiac Risk Factors: Family History - CAD, History of Smoking, Hypertension and Lipids  Symptoms:  Chest Pain with Exertion and DOE   Nuclear Pre-Procedure Caffeine/Decaff Intake:  None NPO After: 7:30pm   Lungs:  clear O2 Sat: 97% on room air. IV 0.9% NS with Angio Cath:  22g  IV Site: R Hand  IV Started by:  Crissie Figures, RN  Chest Size (in):  42 Cup Size: DD  Height: 5\' 2"  (1.575 m)  Weight:  159 lb (72.122 kg)  BMI:  Body mass index is 29.07 kg/(m^2). Tech Comments:  N/A    Nuclear Med Study 1 or 2 day study: 1 day  Stress Test Type:  Stress  Reading MD: N/A  Order Authorizing Provider:  Daneen Schick, MD  Resting Radionuclide: Technetium 87m Sestamibi  Resting Radionuclide Dose: 11.0 mCi   Stress Radionuclide:  Technetium 59m Sestamibi  Stress Radionuclide Dose: 33.0 mCi           Stress Protocol Rest HR: 54 Stress HR: 107  Rest BP: 239/107 Stress BP: 208/103  Exercise Time (min): n/a METS: n/a           Dose of Adenosine (mg):  n/a Dose of Lexiscan: 0.4 mg  Dose of Atropine (mg): n/a Dose of Dobutamine: n/a mcg/kg/min (at max HR)  Stress Test Technologist: Glade Lloyd, BS-ES  Nuclear Technologist:  Charlton Amor, CNMT     Rest Procedure:  Myocardial perfusion imaging was performed at rest 45 minutes following the intravenous administration of Technetium 3m Sestamibi. Rest ECG: NSR with non-specific ST-T wave changes  Stress Procedure:  The patient received IV Lexiscan 0.4 mg over 15-seconds.  Technetium 35m Sestamibi injected at 30-seconds.   Quantitative spect images were obtained after a 45 minute delay.  During the infusion of Lexiscan, patient complained of stomach feeling tight.  This began to resolve in recovery.  Stress ECG: No significant change from baseline ECG. Brief TWI in inferior leads during first minute of Lexiscan infusion. Occasional PVC noted.  QPS Raw Data Images:  Normal; no motion artifact; normal heart/lung ratio. Stress Images:  There is mild apical thinning with normal uptake in other regions. Rest Images:  There is mild apical thinning with normal uptake in other regions. Subtraction (SDS):  No evidence of ischemia. Transient Ischemic Dilatation (Normal <1.22):  0.96 Lung/Heart Ratio (Normal <0.45):  0.27  Quantitative Gated Spect Images QGS EDV:  99 ml QGS ESV:  39 ml  Impression Exercise Capacity:  Lexiscan with no exercise. BP Response:  Hypertensive during test. Clinical Symptoms:  Typical symptoms with Lexiscan. ECG Impression:  No significant ST segment change suggestive of ischemia. Comparison with Prior Nuclear Study: No images to compare  Overall Impression:  Low risk stress nuclear study no ischemia. .  LV Ejection Fraction: 60%.  LV Wall Motion:  NL LV Function; NL Wall Motion  Candee Furbish, MD

## 2014-01-29 ENCOUNTER — Inpatient Hospital Stay: Payer: BC Managed Care – PPO | Admitting: Family Medicine

## 2014-02-02 ENCOUNTER — Telehealth: Payer: Self-pay

## 2014-02-02 NOTE — Telephone Encounter (Signed)
Message copied by Lamar Laundry on Tue Feb 02, 2014  3:22 PM ------      Message from: Daneen Schick      Created: Fri Jan 29, 2014  1:16 PM       No blockage noted ------

## 2014-02-02 NOTE — Telephone Encounter (Signed)
[email or web address] stress test results. No blockage noted.

## 2014-02-12 ENCOUNTER — Other Ambulatory Visit (HOSPITAL_COMMUNITY): Payer: Self-pay | Admitting: Family Medicine

## 2014-03-20 ENCOUNTER — Other Ambulatory Visit: Payer: Self-pay | Admitting: Family Medicine

## 2014-07-02 ENCOUNTER — Other Ambulatory Visit: Payer: Self-pay | Admitting: Family Medicine

## 2014-10-30 ENCOUNTER — Other Ambulatory Visit: Payer: Self-pay | Admitting: Family Medicine

## 2015-02-06 ENCOUNTER — Other Ambulatory Visit: Payer: Self-pay | Admitting: Family Medicine

## 2015-05-05 ENCOUNTER — Other Ambulatory Visit: Payer: Self-pay | Admitting: Family Medicine

## 2015-05-05 NOTE — Telephone Encounter (Signed)
Denied refill.  Has not been seen in our office in over three years.

## 2015-06-20 ENCOUNTER — Other Ambulatory Visit: Payer: Self-pay | Admitting: Family Medicine

## 2015-10-06 ENCOUNTER — Emergency Department (HOSPITAL_COMMUNITY)
Admission: EM | Admit: 2015-10-06 | Discharge: 2015-10-06 | Disposition: A | Discharge status: Home / Self Care | Payer: Self-pay | Attending: Emergency Medicine | Admitting: Emergency Medicine

## 2015-10-06 ENCOUNTER — Emergency Department (HOSPITAL_COMMUNITY): Payer: Self-pay

## 2015-10-06 ENCOUNTER — Encounter (HOSPITAL_COMMUNITY): Payer: Self-pay | Admitting: Emergency Medicine

## 2015-10-06 DIAGNOSIS — Z7982 Long term (current) use of aspirin: Secondary | ICD-10-CM | POA: Insufficient documentation

## 2015-10-06 DIAGNOSIS — R202 Paresthesia of skin: Secondary | ICD-10-CM | POA: Insufficient documentation

## 2015-10-06 DIAGNOSIS — I10 Essential (primary) hypertension: Secondary | ICD-10-CM | POA: Insufficient documentation

## 2015-10-06 DIAGNOSIS — R51 Headache: Secondary | ICD-10-CM | POA: Insufficient documentation

## 2015-10-06 DIAGNOSIS — R519 Headache, unspecified: Secondary | ICD-10-CM

## 2015-10-06 DIAGNOSIS — R110 Nausea: Secondary | ICD-10-CM | POA: Insufficient documentation

## 2015-10-06 DIAGNOSIS — Z87891 Personal history of nicotine dependence: Secondary | ICD-10-CM | POA: Insufficient documentation

## 2015-10-06 LAB — URINALYSIS, ROUTINE W REFLEX MICROSCOPIC
Bilirubin Urine: NEGATIVE
Glucose, UA: NEGATIVE mg/dL
KETONES UR: NEGATIVE mg/dL
Nitrite: NEGATIVE
PH: 5 (ref 5.0–8.0)
Protein, ur: NEGATIVE mg/dL
SPECIFIC GRAVITY, URINE: 1.011 (ref 1.005–1.030)
Urobilinogen, UA: 0.2 mg/dL (ref 0.0–1.0)

## 2015-10-06 LAB — CBC
HCT: 35.1 % — ABNORMAL LOW (ref 36.0–46.0)
Hemoglobin: 12.2 g/dL (ref 12.0–15.0)
MCH: 29.7 pg (ref 26.0–34.0)
MCHC: 34.8 g/dL (ref 30.0–36.0)
MCV: 85.4 fL (ref 78.0–100.0)
PLATELETS: 199 10*3/uL (ref 150–400)
RBC: 4.11 MIL/uL (ref 3.87–5.11)
RDW: 14 % (ref 11.5–15.5)
WBC: 4.1 10*3/uL (ref 4.0–10.5)

## 2015-10-06 LAB — BASIC METABOLIC PANEL
Anion gap: 9 (ref 5–15)
BUN: 17 mg/dL (ref 6–20)
CALCIUM: 9.9 mg/dL (ref 8.9–10.3)
CO2: 27 mmol/L (ref 22–32)
CREATININE: 0.76 mg/dL (ref 0.44–1.00)
Chloride: 108 mmol/L (ref 101–111)
GFR calc Af Amer: >60 mL/min (ref >60)
GFR calc non Af Amer: >60 mL/min (ref >60)
Glucose, Bld: 117 mg/dL — ABNORMAL HIGH (ref 65–99)
Potassium: 3.2 mmol/L — ABNORMAL LOW (ref 3.5–5.1)
SODIUM: 144 mmol/L (ref 135–145)

## 2015-10-06 LAB — URINE MICROSCOPIC-ADD ON

## 2015-10-06 MED ORDER — SODIUM CHLORIDE 0.9 % IV BOLUS (SEPSIS)
1000.0000 mL | Freq: Once | INTRAVENOUS | Status: AC
  Administered 2015-10-06: 1000 mL via INTRAVENOUS

## 2015-10-06 MED ORDER — HYDRALAZINE HCL 10 MG PO TABS
10.0000 mg | ORAL_TABLET | Freq: Once | ORAL | Status: AC
  Administered 2015-10-06: 10 mg via ORAL
  Filled 2015-10-06: qty 1

## 2015-10-06 MED ORDER — ONDANSETRON HCL 4 MG/2ML IJ SOLN
4.0000 mg | Freq: Once | INTRAMUSCULAR | Status: AC
  Administered 2015-10-06: 4 mg via INTRAVENOUS
  Filled 2015-10-06: qty 2

## 2015-10-06 MED ORDER — ACETAMINOPHEN 500 MG PO TABS
1000.0000 mg | ORAL_TABLET | Freq: Once | ORAL | Status: AC
  Administered 2015-10-06: 1000 mg via ORAL
  Filled 2015-10-06: qty 2

## 2015-10-06 MED ORDER — AMLODIPINE BESYLATE 5 MG PO TABS
5.0000 mg | ORAL_TABLET | Freq: Every day | ORAL | Status: DC
  Administered 2015-10-06: 5 mg via ORAL
  Filled 2015-10-06: qty 1

## 2015-10-06 NOTE — Discharge Instructions (Signed)
General Headache Without Cause °A headache is pain or discomfort felt around the head or neck area. The specific cause of a headache may not be found. There are many causes and types of headaches. A few common ones are: °· Tension headaches. °· Migraine headaches. °· Cluster headaches. °· Chronic daily headaches. °HOME CARE INSTRUCTIONS  °Watch your condition for any changes. Take these steps to help with your condition: °Managing Pain °· Take over-the-counter and prescription medicines only as told by your health care provider. °· Lie down in a dark, quiet room when you have a headache. °· If directed, apply ice to the head and neck area: °· Put ice in a plastic bag. °· Place a towel between your skin and the bag. °· Leave the ice on for 20 minutes, 2-3 times per day. °· Use a heating pad or hot shower to apply heat to the head and neck area as told by your health care provider. °· Keep lights dim if bright lights bother you or make your headaches worse. °Eating and Drinking °· Eat meals on a regular schedule. °· Limit alcohol use. °· Decrease the amount of caffeine you drink, or stop drinking caffeine. °General Instructions °· Keep all follow-up visits as told by your health care provider. This is important. °· Keep a headache journal to help find out what may trigger your headaches. For example, write down: °· What you eat and drink. °· How much sleep you get. °· Any change to your diet or medicines. °· Try massage or other relaxation techniques. °· Limit stress. °· Sit up straight, and do not tense your muscles. °· Do not use tobacco products, including cigarettes, chewing tobacco, or e-cigarettes. If you need help quitting, ask your health care provider. °· Exercise regularly as told by your health care provider. °· Sleep on a regular schedule. Get 7-9 hours of sleep, or the amount recommended by your health care provider. °SEEK MEDICAL CARE IF:  °· Your symptoms are not helped by medicine. °· You have a  headache that is different from the usual headache. °· You have nausea or you vomit. °· You have a fever. °SEEK IMMEDIATE MEDICAL CARE IF:  °· Your headache becomes severe. °· You have repeated vomiting. °· You have a stiff neck. °· You have a loss of vision. °· You have problems with speech. °· You have pain in the eye or ear. °· You have muscular weakness or loss of muscle control. °· You lose your balance or have trouble walking. °· You feel faint or pass out. °· You have confusion. °  °This information is not intended to replace advice given to you by your health care provider. Make sure you discuss any questions you have with your health care provider. °  °Document Released: 12/10/2005 Document Revised: 08/31/2015 Document Reviewed: 04/04/2015 °Elsevier Interactive Patient Education ©2016 [employer] ° °Hypertension °Hypertension, commonly called high blood pressure, is when the force of blood pumping through your arteries is too strong. Your arteries are the blood vessels that carry blood from your heart throughout your body. A blood pressure reading consists of a higher number over a lower number, such as 110/72. The higher number (systolic) is the pressure inside your arteries when your heart pumps. The lower number (diastolic) is the pressure inside your arteries when your heart relaxes. Ideally you want your blood pressure below 120/80. °Hypertension forces your heart to work harder to pump blood. Your arteries may become narrow or stiff. Having untreated or   uncontrolled hypertension can cause heart attack, stroke, kidney disease, and other problems. °RISK FACTORS °Some risk factors for high blood pressure are controllable. Others are not.  °Risk factors you cannot control include:  °· Race. You may be at higher risk if you are African American. °· Age. Risk increases with age. °· Gender. Men are at higher risk than women before age 45 years. After age 65, women are at higher risk than men. °Risk factors  you can control include: °· Not getting enough exercise or physical activity. °· Being overweight. °· Getting too much fat, sugar, calories, or salt in your diet. °· Drinking too much alcohol. °SIGNS AND SYMPTOMS °Hypertension does not usually cause signs or symptoms. Extremely high blood pressure (hypertensive crisis) may cause headache, anxiety, shortness of breath, and nosebleed. °DIAGNOSIS °To check if you have hypertension, your health care provider will measure your blood pressure while you are seated, with your arm held at the level of your heart. It should be measured at least twice using the same arm. Certain conditions can cause a difference in blood pressure between your right and left arms. A blood pressure reading that is higher than normal on one occasion does not mean that you need treatment. If it is not clear whether you have high blood pressure, you may be asked to return on a different day to have your blood pressure checked again. Or, you may be asked to monitor your blood pressure at home for 1 or more weeks. °TREATMENT °Treating high blood pressure includes making lifestyle changes and possibly taking medicine. Living a healthy lifestyle can help lower high blood pressure. You may need to change some of your habits. °Lifestyle changes may include: °· Following the DASH diet. This diet is high in fruits, vegetables, and whole grains. It is low in salt, red meat, and added sugars. °· Keep your sodium intake below 2,300 mg per day. °· Getting at least 30-45 minutes of aerobic exercise at least 4 times per week. °· Losing weight if necessary. °· Not smoking. °· Limiting alcoholic beverages. °· Learning ways to reduce stress. °Your health care provider may prescribe medicine if lifestyle changes are not enough to get your blood pressure under control, and if one of the following is true: °· You are 18-59 years of age and your systolic blood pressure is above 140. °· You are 60 years of age or older,  and your systolic blood pressure is above 150. °· Your diastolic blood pressure is above 90. °· You have diabetes, and your systolic blood pressure is over 140 or your diastolic blood pressure is over 90. °· You have kidney disease and your blood pressure is above 140/90. °· You have heart disease and your blood pressure is above 140/90. °Your personal target blood pressure may vary depending on your medical conditions, your age, and other factors. °HOME CARE INSTRUCTIONS °· Have your blood pressure rechecked as directed by your health care provider.   °· Take medicines only as directed by your health care provider. Follow the directions carefully. Blood pressure medicines must be taken as prescribed. The medicine does not work as well when you skip doses. Skipping doses also puts you at risk for problems. °· Do not smoke.   °· Monitor your blood pressure at home as directed by your health care provider.  °SEEK MEDICAL CARE IF:  °· You think you are having a reaction to medicines taken. °· You have recurrent headaches or feel dizzy. °· You have swelling in your   ankles. °· You have trouble with your vision. °SEEK IMMEDIATE MEDICAL CARE IF: °· You develop a severe headache or confusion. °· You have unusual weakness, numbness, or feel faint. °· You have severe chest or abdominal pain. °· You vomit repeatedly. °· You have trouble breathing. °MAKE SURE YOU:  °· Understand these instructions. °· Will watch your condition. °· Will get help right away if you are not doing well or get worse. °  °This information is not intended to replace advice given to you by your health care provider. Make sure you discuss any questions you have with your health care provider. °  °Document Released: 12/10/2005 Document Revised: 04/26/2015 Document Reviewed: 10/02/2013 °Elsevier Interactive Patient Education ©2016 Elsevier Inc. ° °

## 2015-10-06 NOTE — ED Notes (Signed)
Pt c/o of a headache that started last night that caused her to become nauseous. Pt states she felt SOB this morning when she bent over, but denies SOB now. Pt states she felt pain across both shoulders that caused her arm to "go tingling and heavy", pt now states this sensation is only on the right side. Pt states she also felt this sensation in both legs but it is now only in the right leg. Pt also states back pain.

## 2015-10-06 NOTE — ED Provider Notes (Signed)
CSN: [id number]     Arrival date & time 10/06/15  0254 History   First MD Initiated Contact with Patient 10/06/15 [phone]     Chief Complaint  Patient presents with  . Numbness    to R arm  . Headache  . Nausea     (Consider location/radiation/quality/duration/timing/severity/associated sxs/prior Treatment) Patient is a 64 y.o. female presenting with headaches.  Headache Pain location:  Generalized Quality:  Dull Radiates to:  Does not radiate Pain severity now: mild. Onset quality:  Gradual Duration:  9 hours Timing:  Constant Progression:  Partially resolved Chronicity:  New Relieved by:  Nothing Associated symptoms: nausea   Associated symptoms: no blurred vision, no focal weakness, no numbness, no photophobia and no vomiting   Associated symptoms comment:  Tingling - started in her left arm and leg, then moved to her right arm and leg.  Now only on posterior right upper arm.   Past Medical History  Diagnosis Date  . Hypertension    Past Surgical History  Procedure Laterality Date  . Abdominal hysterectomy     History reviewed. No pertinent family history. Social History  Substance Use Topics  . Smoking status: Former Smoker -- 5 years    Quit date: 08/16/1989  . Smokeless tobacco: Never Used  . Alcohol Use: No   OB History    No data available     Review of Systems  Eyes: Negative for blurred vision and photophobia.  Gastrointestinal: Positive for nausea. Negative for vomiting.  Neurological: Positive for headaches. Negative for focal weakness and numbness.  All other systems reviewed and are negative.     Allergies  Lipitor  Home Medications   Prior to Admission medications   Medication Sig Start Date End Date Taking? Authorizing Provider  aspirin 81 MG EC tablet TAKE 1 TABLET (81 MG TOTAL) BY MOUTH DAILY.   Yes Zenia Resides, MD  lisinopril-hydrochlorothiazide (PRINZIDE,ZESTORETIC) 20-12.5 MG per tablet TAKE 1 TABLET BY MOUTH DAILY. 06/21/15   Yes Zenia Resides, MD   BP 219/87 mmHg  Pulse 59  Temp(Src) 98.9 F (37.2 C) (Oral)  Resp 16  Ht 5\' 1"  (1.549 m)  Wt 170 lb (77.111 kg)  BMI 32.14 kg/m2  SpO2 99% Physical Exam  Constitutional: She is oriented to person, place, and time. She appears well-developed and well-nourished. No distress.  HENT:  Head: Normocephalic and atraumatic.  Mouth/Throat: Oropharynx is clear and moist.  Eyes: Conjunctivae are normal. Pupils are equal, round, and reactive to light. No scleral icterus.  Neck: Neck supple.  Cardiovascular: Normal rate, regular rhythm, normal heart sounds and intact distal pulses.   No murmur heard. Pulmonary/Chest: Effort normal and breath sounds normal. No stridor. No respiratory distress. She has no rales.  Abdominal: Soft. Bowel sounds are normal. She exhibits no distension. There is no tenderness.  Musculoskeletal: Normal range of motion.  Neurological: She is alert and oriented to person, place, and time. She has normal strength. No cranial nerve deficit or sensory deficit (normal sensation to soft touch and pinprick throughout). Coordination and gait normal. GCS eye subscore is 4. GCS verbal subscore is 5. GCS motor subscore is 6.  Skin: Skin is warm and dry. No rash noted.  Psychiatric: She has a normal mood and affect. Her behavior is normal.  Nursing note and vitals reviewed.   ED Course  Procedures (including critical care time) Labs Review Labs Reviewed  BASIC METABOLIC PANEL - Abnormal; Notable for the following:    Potassium 3.2 (*)  Glucose, Bld 117 (*)    All other components within normal limits  CBC - Abnormal; Notable for the following:    HCT 35.1 (*)    All other components within normal limits  URINALYSIS, ROUTINE W REFLEX MICROSCOPIC (NOT AT [facility]) - Abnormal; Notable for the following:    APPearance CLOUDY (*)    Hgb urine dipstick SMALL (*)    Leukocytes, UA TRACE (*)    All other components within normal limits  URINE  MICROSCOPIC-ADD ON    Imaging Review Ct Head Wo Contrast  10/06/2015  CLINICAL DATA:  Left upper extremity tingling and vertigo for 1 day EXAM: CT HEAD WITHOUT CONTRAST TECHNIQUE: Contiguous axial images were obtained from the base of the skull through the vertex without intravenous contrast. COMPARISON:  January 10, 2014 FINDINGS: The ventricles are normal in size and configuration. There is no intracranial mass, hemorrhage, extra-axial fluid collection, or midline shift. There is patchy small vessel disease in the posterior centra semiovale bilaterally. No new gray-white compartment lesion is identified. No acute infarct evident. The bony calvarium appears intact. The mastoid air cells are clear. There are retention cysts in the right maxillary antrum. There is mild mucosal thickening in several ethmoid air cells bilaterally. There is nasal turbinate edema bilaterally. IMPRESSION: Periventricular small vessel disease, stable. No acute infarct evident. No hemorrhage or mass effect. There is paranasal sinus disease. There is nasal turbinate edema bilaterally. Electronically Signed   By: Lowella Grip III M.D.   On: 10/06/2015 10:18   I have personally reviewed and evaluated these images and lab results as part of my medical decision-making.   EKG Interpretation   Date/Time:  Thursday October 06 2015 09:08:39 EDT Ventricular Rate:  65 PR Interval:  157 QRS Duration: 96 QT Interval:  495 QTC Calculation: 515 R Axis:   -3 Text Interpretation:  Sinus rhythm Abnormal R-wave progression, early  transition LVH with secondary repolarization abnormality Prolonged QT  interval diffuse t wave abnormalities not as prominent compared to prior  Confirmed by [facility]  MD, TREY ([id number]) on 10/06/2015 10:25:49 AM      MDM   Final diagnoses:  Essential hypertension  Tingling  Nonintractable headache, unspecified chronicity pattern, unspecified headache type    64 yo female with hx of htn presenting  with headache that then progressed to atypical tingling.  Objectively, her neuro exam is normal and her blood pressure is elevated.     Given antihypertensives and tylenol for headache.  Afterwards, she felt much better.  Her presentation is not consistent with CVA.  Low suspicion for [facility] based on atypical history, normal exam.  Her hypertension could have contributed to her symptoms.  As such, advised that she follow up in Same Day clinic at family practice tomorrow for further blood pressure management.      Serita Grit, MD 10/07/15 2040

## 2015-10-06 NOTE — ED Notes (Signed)
Pt ambulated the hall without c/o dizziness, feeling lightheaded, or weak.

## 2016-06-26 ENCOUNTER — Other Ambulatory Visit: Payer: Self-pay | Admitting: Family Medicine

## 2016-07-19 ENCOUNTER — Other Ambulatory Visit: Payer: Self-pay | Admitting: Family Medicine

## 2016-07-27 ENCOUNTER — Other Ambulatory Visit: Payer: Self-pay | Admitting: Family Medicine

## 2017-04-28 ENCOUNTER — Other Ambulatory Visit: Payer: Self-pay | Admitting: Family Medicine

## 2017-06-03 ENCOUNTER — Other Ambulatory Visit: Payer: Self-pay | Admitting: Family Medicine

## 2017-07-10 ENCOUNTER — Encounter: Payer: Self-pay | Admitting: Family Medicine

## 2017-07-10 ENCOUNTER — Ambulatory Visit (INDEPENDENT_AMBULATORY_CARE_PROVIDER_SITE_OTHER): Payer: Medicare HMO | Admitting: Family Medicine

## 2017-07-10 ENCOUNTER — Ambulatory Visit (HOSPITAL_COMMUNITY)
Admission: RE | Admit: 2017-07-10 | Discharge: 2017-07-10 | Disposition: A | Discharge status: Home / Self Care | Payer: Medicare HMO | Source: Ambulatory Visit | Attending: Family Medicine | Admitting: Family Medicine

## 2017-07-10 VITALS — BP 210/92 | HR 57 | Temp 98.3°F | Ht 61.0 in | Wt 172.2 lb

## 2017-07-10 DIAGNOSIS — Z114 Encounter for screening for human immunodeficiency virus [HIV]: Secondary | ICD-10-CM | POA: Diagnosis not present

## 2017-07-10 DIAGNOSIS — I498 Other specified cardiac arrhythmias: Secondary | ICD-10-CM | POA: Diagnosis not present

## 2017-07-10 DIAGNOSIS — I499 Cardiac arrhythmia, unspecified: Secondary | ICD-10-CM

## 2017-07-10 DIAGNOSIS — I517 Cardiomegaly: Secondary | ICD-10-CM | POA: Diagnosis not present

## 2017-07-10 DIAGNOSIS — I493 Ventricular premature depolarization: Secondary | ICD-10-CM

## 2017-07-10 DIAGNOSIS — E7800 Pure hypercholesterolemia, unspecified: Secondary | ICD-10-CM | POA: Diagnosis not present

## 2017-07-10 DIAGNOSIS — Z23 Encounter for immunization: Secondary | ICD-10-CM | POA: Diagnosis not present

## 2017-07-10 DIAGNOSIS — Z1159 Encounter for screening for other viral diseases: Secondary | ICD-10-CM | POA: Diagnosis not present

## 2017-07-10 DIAGNOSIS — I10 Essential (primary) hypertension: Secondary | ICD-10-CM | POA: Diagnosis not present

## 2017-07-10 MED ORDER — LISINOPRIL-HYDROCHLOROTHIAZIDE 20-12.5 MG PO TABS: 1.0000 | ORAL_TABLET | Freq: Every day | ORAL | 3 refills | Status: DC

## 2017-07-10 NOTE — Patient Instructions (Signed)
See me in 2-3 weeks to recheck your blood pressure. I will go over your lab results at that visit.  I will call sooner if there is a problem with any of the labs. I put in a referral to Dr. Tamala Julian, cardiology. You should see me at least every six months.

## 2017-07-10 NOTE — Addendum Note (Signed)
Addended by: Katharina Caper, APRIL D on: 07/10/2017 05:22 PM   Modules accepted: Orders, SmartSet

## 2017-07-10 NOTE — Assessment & Plan Note (Signed)
>>  ASSESSMENT AND PLAN FOR HYPERCHOLESTEROLEMIA WRITTEN ON 07/11/2017  1:25 PM BY HENSEL, Santiago Bumpers, MD  Check lipids At risk for CAD likely needs statin Marked elevation of LDL.  Will add statin  Start with pravastatin since atorvastatin intollerant in the past.  Called and LM with patient.

## 2017-07-10 NOTE — Progress Notes (Signed)
   Subjective:    Patient ID: [name], female    DOB: 12/21/1951, 66 y.o.   MRN: [id number]  HPI First visit in over five years.  Fortunately she has seemingly done well.  Weight stable.  Active via work (housecleaning.)  No complaints.  Issues Hypertension.  Out of all meds.  Not surprisingly, BP is up.  Denies CP, DOE or stroke sx. Way behind on screening.  Due for Mammogram Colonoscopy. HIV, Hep c and lipids. Now 25 and due for Prevnar.    Review of Systems No HAs, vision change, DOE, CP, indigestion, appetite change, change in bowel or bladder, new skin lesions, bleeding.     Objective:   Physical Exam  VS noted. HEENT normal Neck supple  Lungs clear Cardiac regularly irregula,r 1/6 SEM  EKG shows unifocal PVCs and LVH.  Computer reads a fib but that is inaccurate. Abd benign Ext no edema Neuro Normal gait and mentation.        Assessment & Plan:

## 2017-07-10 NOTE — Assessment & Plan Note (Signed)
Screen x 1 

## 2017-07-10 NOTE — Assessment & Plan Note (Addendum)
Has LVH and frequent PVCs on EKG.  Despite computer read, she is not in a fib. Given risk, will refer back to her previous cardiologist.

## 2017-07-10 NOTE — Assessment & Plan Note (Addendum)
Check lipids At risk for CAD likely needs statin Marked elevation of LDL.  Will add statin  Start with pravastatin since atorvastatin intollerant in the past.  Called and LM with patient.

## 2017-07-10 NOTE — Assessment & Plan Note (Signed)
States home control OK when on meds.  I don't know for sure.  FU 2-3 weeks when back on meds to assure good control

## 2017-07-11 LAB — CMP14+EGFR
ALK PHOS: 82 IU/L (ref 39–117)
ALT: 9 IU/L (ref 0–32)
AST: 15 IU/L (ref 0–40)
Albumin/Globulin Ratio: 1.6 (ref 1.2–2.2)
Albumin: 4.2 g/dL (ref 3.6–4.8)
BILIRUBIN TOTAL: 0.9 mg/dL (ref 0.0–1.2)
BUN/Creatinine Ratio: 24 (ref 12–28)
BUN: 20 mg/dL (ref 8–27)
CO2: 22 mmol/L (ref 20–29)
Calcium: 9.6 mg/dL (ref 8.7–10.3)
Chloride: 105 mmol/L (ref 96–106)
Creatinine, Ser: 0.85 mg/dL (ref 0.57–1.00)
GFR calc Af Amer: 83 mL/min/{1.73_m2} (ref >59)
GFR calc non Af Amer: 72 mL/min/{1.73_m2} (ref >59)
GLUCOSE: 88 mg/dL (ref 65–99)
Globulin, Total: 2.6 g/dL (ref 1.5–4.5)
Potassium: 3.8 mmol/L (ref 3.5–5.2)
Sodium: 143 mmol/L (ref 134–144)
TOTAL PROTEIN: 6.8 g/dL (ref 6.0–8.5)

## 2017-07-11 LAB — CBC
HEMATOCRIT: 39 % (ref 34.0–46.6)
HEMOGLOBIN: 12.7 g/dL (ref 11.1–15.9)
MCH: 29.1 pg (ref 26.6–33.0)
MCHC: 32.6 g/dL (ref 31.5–35.7)
MCV: 89 fL (ref 79–97)
Platelets: 224 10*3/uL (ref 150–379)
RBC: 4.36 x10E6/uL (ref 3.77–5.28)
RDW: 15.4 % (ref 12.3–15.4)
WBC: 5.1 10*3/uL (ref 3.4–10.8)

## 2017-07-11 LAB — LIPID PANEL
CHOL/HDL RATIO: 4.9 ratio — AB (ref 0.0–4.4)
Cholesterol, Total: 221 mg/dL — ABNORMAL HIGH (ref 100–199)
HDL: 45 mg/dL (ref >39)
LDL Calculated: 161 mg/dL — ABNORMAL HIGH (ref 0–99)
Triglycerides: 74 mg/dL (ref 0–149)
VLDL CHOLESTEROL CAL: 15 mg/dL (ref 5–40)

## 2017-07-11 LAB — HEPATITIS C ANTIBODY: Hep C Virus Ab: <0.1 s/co ratio (ref 0.0–0.9)

## 2017-07-11 LAB — HIV ANTIBODY (ROUTINE TESTING W REFLEX): HIV SCREEN 4TH GENERATION: NONREACTIVE

## 2017-07-11 MED ORDER — PRAVASTATIN SODIUM 40 MG PO TABS: 40.0000 mg | ORAL_TABLET | Freq: Every day | ORAL | 12 refills | Status: DC

## 2017-07-11 NOTE — Addendum Note (Signed)
Addended by: Zenia Resides on: 07/11/2017 01:25 PM   Modules accepted: Orders

## 2017-07-25 ENCOUNTER — Other Ambulatory Visit: Payer: Self-pay | Admitting: Family Medicine

## 2017-07-25 ENCOUNTER — Other Ambulatory Visit (HOSPITAL_COMMUNITY): Payer: Self-pay | Admitting: Interventional Radiology

## 2017-07-25 DIAGNOSIS — Z1231 Encounter for screening mammogram for malignant neoplasm of breast: Secondary | ICD-10-CM

## 2017-07-29 ENCOUNTER — Ambulatory Visit: Payer: Medicare HMO | Admitting: Family Medicine

## 2017-08-05 ENCOUNTER — Ambulatory Visit: Payer: Medicare HMO

## 2017-08-14 ENCOUNTER — Ambulatory Visit
Admission: RE | Admit: 2017-08-14 | Discharge: 2017-08-14 | Disposition: A | Discharge status: Home / Self Care | Payer: Medicare HMO | Source: Ambulatory Visit | Attending: Family Medicine | Admitting: Family Medicine

## 2017-08-14 DIAGNOSIS — Z1231 Encounter for screening mammogram for malignant neoplasm of breast: Secondary | ICD-10-CM

## 2017-09-09 ENCOUNTER — Encounter: Payer: Self-pay | Admitting: Interventional Cardiology

## 2017-09-09 ENCOUNTER — Ambulatory Visit (INDEPENDENT_AMBULATORY_CARE_PROVIDER_SITE_OTHER): Payer: Medicare HMO | Admitting: Interventional Cardiology

## 2017-09-09 VITALS — BP 170/94 | HR 65 | Ht 61.0 in | Wt 175.4 lb

## 2017-09-09 DIAGNOSIS — I10 Essential (primary) hypertension: Secondary | ICD-10-CM | POA: Diagnosis not present

## 2017-09-09 DIAGNOSIS — E7800 Pure hypercholesterolemia, unspecified: Secondary | ICD-10-CM | POA: Diagnosis not present

## 2017-09-09 DIAGNOSIS — I493 Ventricular premature depolarization: Secondary | ICD-10-CM

## 2017-09-09 DIAGNOSIS — R0683 Snoring: Secondary | ICD-10-CM

## 2017-09-09 DIAGNOSIS — I517 Cardiomegaly: Secondary | ICD-10-CM | POA: Diagnosis not present

## 2017-09-09 MED ORDER — METOPROLOL SUCCINATE ER 50 MG PO TB24: 50.0000 mg | ORAL_TABLET | Freq: Every day | ORAL | 3 refills | Status: DC

## 2017-09-09 NOTE — Patient Instructions (Signed)
Medication Instructions:  1) START Metoprolol Succinate 50mg  once daily  Labwork: None  Testing/Procedures: Your physician has requested that you have an echocardiogram. Echocardiography is a painless test that uses sound waves to create images of your heart. It provides your doctor with information about the size and shape of your heart and how well your heart's chambers and valves are working. This procedure takes approximately one hour. There are no restrictions for this procedure.  Your physician has recommended that you wear a 24 hour holter monitor. Holter monitors are medical devices that record the heart's electrical activity. Doctors most often use these monitors to diagnose arrhythmias. Arrhythmias are problems with the speed or rhythm of the heartbeat. The monitor is a small, portable device. You can wear one while you do your normal daily activities. This is usually used to diagnose what is causing palpitations/syncope (passing out).    Follow-Up:  Your physician recommends that you schedule a follow-up appointment in: 2 weeks with the Hypertension Clinic.   Your physician recommends that you schedule a follow-up appointment in: 4-6 weeks with Dr. Tamala Julian.   Any Other Special Instructions Will Be Listed Below (If Applicable).     If you need a refill on your cardiac medications before your next appointment, please call your pharmacy.

## 2017-09-09 NOTE — Progress Notes (Signed)
Cardiology Office Note    Date:  09/09/2017   ID:  [name], DOB December 16, 1951, MRN [id number]  PCP:  Zenia Resides, MD  Cardiologist: Sinclair Grooms, MD   Chief Complaint  Patient presents with  . Irregular Heart Beat  . Hypertension    LVH    History of Present Illness:  [name] is a 66 y.o. female is referred by Dr. Madison Hickman because of EKG demonstrating LVH and frequent PVCs. Prior negative workup for coronary disease with low risk myocardial perfusion study February 2015. Echocardiogram in 2015 demonstrated moderate LVH.  The patient has some dyspnea on exertion. She is able to climb stairs without significant limitation but gets dyspneic. She denies palpitations. She has not had syncope or chest pain.Marland KitchenHence became concerned because of frequent premature contractions noted on auscultation and by EKG during a relatively recent office visit.  No lower extremity swelling, orthopnea, or PND has been noted. She states compliance with medical therapy. 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  Past Medical History:  Diagnosis Date  . Hypertension     Past Surgical History:  Procedure Laterality Date  . ABDOMINAL HYSTERECTOMY      Current Medications: Outpatient Medications Prior to Visit  Medication Sig Dispense Refill  . aspirin 81 MG EC tablet TAKE 1 TABLET (81 MG TOTAL) BY MOUTH DAILY. 90 tablet 3  . lisinopril-hydrochlorothiazide (PRINZIDE,ZESTORETIC) 20-12.5 MG tablet Take 1 tablet by mouth daily. 90 tablet 3  . pravastatin (PRAVACHOL) 40 MG tablet Take 1 tablet (40 mg total) by mouth daily. 30 tablet 12   No facility-administered medications prior to visit.      Allergies:   Lipitor [atorvastatin]   Social History   Social History  . Marital status: Divorced    Spouse name: N/A  . Number of children: N/A  . Years of education: N/A   Social History Main Topics  . Smoking status: Former Smoker    Years:  5.00    Quit date: 08/16/1989  . Smokeless tobacco: Never Used  . Alcohol use No  . Drug use: No  . Sexual activity: No   Other Topics Concern  . None   Social History Narrative  . None     Family History:  The patient's family history is not on file. 2 brothers and her mother had heart attacks and heart failure  ROS:   Please see the history of present illness.    Easy bruising on her lower extremities. Denies medication side effects other than inability to use atorvastatin. Now on pravastatin. Snores loudly according to family. Awakens frequently from sleep at night.  All other systems reviewed and are negative.   PHYSICAL EXAM:   VS:  BP (!) 170/94 (BP Location: Right Arm)   Pulse 65   Ht 5\' 1"  (1.549 m)   Wt 175 lb 6.4 oz (79.6 kg)   BMI 33.14 kg/m    GEN: Well nourished, well developed, in no acute distress  HEENT: normal  Neck: no JVD, carotid bruits, or masses Cardiac: Irregular RR; no murmurs, rubs, or gallops,no edema  Respiratory:  clear to auscultation bilaterally, normal work of breathing GI: soft, nontender, nondistended, + BS MS: no deformity or atrophy  Skin: warm and dry, no rash Neuro:  Alert and Oriented x 3, Strength and sensation are intact Psych: euthymic mood, full affect  Wt Readings from Last 3 Encounters:  09/09/17 175 lb 6.4 oz (79.6 kg)  07/10/17 172  lb 3.2 oz (78.1 kg)  10/06/15 170 lb (77.1 kg)      Studies/Labs Reviewed:   EKG:  EKG  Normal sinus rhythm, LVH with frequent PVCs noted on 07/10/2017.  Recent Labs: 07/10/2017: ALT 9; BUN 20; Creatinine, Ser 0.85; Hemoglobin 12.7; Platelets 224; Potassium 3.8; Sodium 143   Lipid Panel    Component Value Date/Time   CHOL 221 (H) 07/10/2017 1424   TRIG 74 07/10/2017 1424   HDL 45 07/10/2017 1424   CHOLHDL 4.9 (H) 07/10/2017 1424   CHOLHDL 4.3 01/11/2014 0250   VLDL 11 01/11/2014 0250   LDLCALC 161 (H) 07/10/2017 1424   LDLDIRECT 140 (H) 08/17/2011 0949    Additional studies/  records that were reviewed today include:  Echocardiogram January 2015: Study Conclusions  Left ventricle: The cavity size was normal. Wall thickness was increased in a pattern of moderate LVH. Systolic function was normal. The estimated ejection fraction was in the range of 60% to 65%. Wall motion was normal; there were no regional wall motion abnormalities. There was an increased relative contribution of atrial contraction to ventricular filling.      ASSESSMENT:    1. HYPERTENSION, BENIGN SYSTEMIC   2. PVC's (premature ventricular contractions)   3. VENTRICULAR HYPERTROPHY, LEFT   4. HYPERCHOLESTEROLEMIA   5. Snoring      PLAN:  In order of problems listed above:  1. Very poor blood pressure control. Target should be 140/90 mmHg or less. This is driving likely progression of LVH. Rule out systolic dysfunction. Better blood pressure control is needed. Will add metoprolol succinate 50 mg per day. We will then need to further uptitrate ACE inhibitor therapy if necessary. Low salt diet discussed. 2. 24-hour Holter monitor will be performed to exclude VT and 2 evaluate burden of ventricular ectopy. 3. 2-D Doppler echocardiogram will be performed to reassess LVH. This will give insight as to blood pressure control over the past 4 years. 4. Continue statin therapy. 5. May need to be evaluated for sleep apnea.  Pressure clinic follow-up in 2 weeks. Further up titration of therapy if pressure is elevated. With further increase ACE inhibitor. \  Medication Adjustments/Labs and Tests Ordered: Current medicines are reviewed at length with the patient today.  Concerns regarding medicines are outlined above.  Medication changes, Labs and Tests ordered today are listed in the Patient Instructions below. Patient Instructions  Medication Instructions:  1) START Metoprolol Succinate 50mg  once daily  Labwork: None  Testing/Procedures: Your physician has requested that you have an  echocardiogram. Echocardiography is a painless test that uses sound waves to create images of your heart. It provides your doctor with information about the size and shape of your heart and how well your heart's chambers and valves are working. This procedure takes approximately one hour. There are no restrictions for this procedure.  Your physician has recommended that you wear a 24 hour holter monitor. Holter monitors are medical devices that record the heart's electrical activity. Doctors most often use these monitors to diagnose arrhythmias. Arrhythmias are problems with the speed or rhythm of the heartbeat. The monitor is a small, portable device. You can wear one while you do your normal daily activities. This is usually used to diagnose what is causing palpitations/syncope (passing out).    Follow-Up:  Your physician recommends that you schedule a follow-up appointment in: 2 weeks with the Hypertension Clinic.   Your physician recommends that you schedule a follow-up appointment in: 4-6 weeks with Dr. Tamala Julian.  Any Other Special Instructions Will Be Listed Below (If Applicable).     If you need a refill on your cardiac medications before your next appointment, please call your pharmacy.      Signed, Sinclair Grooms, MD  09/09/2017 9:35 AM    [facility] [address] [place], [place], [place]  [address] Phone: [phone]; Fax: [phone]

## 2017-09-25 ENCOUNTER — Ambulatory Visit (INDEPENDENT_AMBULATORY_CARE_PROVIDER_SITE_OTHER): Payer: Medicare HMO | Admitting: Pharmacist

## 2017-09-25 ENCOUNTER — Ambulatory Visit (INDEPENDENT_AMBULATORY_CARE_PROVIDER_SITE_OTHER): Payer: Medicare HMO

## 2017-09-25 ENCOUNTER — Other Ambulatory Visit: Payer: Self-pay

## 2017-09-25 ENCOUNTER — Ambulatory Visit (HOSPITAL_COMMUNITY): Payer: Medicare HMO | Attending: Cardiology

## 2017-09-25 DIAGNOSIS — I420 Dilated cardiomyopathy: Secondary | ICD-10-CM | POA: Insufficient documentation

## 2017-09-25 DIAGNOSIS — I5030 Unspecified diastolic (congestive) heart failure: Secondary | ICD-10-CM | POA: Diagnosis not present

## 2017-09-25 DIAGNOSIS — I10 Essential (primary) hypertension: Secondary | ICD-10-CM

## 2017-09-25 DIAGNOSIS — I493 Ventricular premature depolarization: Secondary | ICD-10-CM

## 2017-09-25 DIAGNOSIS — I517 Cardiomegaly: Secondary | ICD-10-CM | POA: Diagnosis not present

## 2017-09-25 LAB — ECHOCARDIOGRAM COMPLETE

## 2017-09-25 MED ORDER — CLONIDINE HCL 0.1 MG PO TABS: 0.2000 mg | ORAL_TABLET | Freq: Once | ORAL | Status: DC

## 2017-09-25 MED ORDER — LISINOPRIL-HYDROCHLOROTHIAZIDE 20-12.5 MG PO TABS: 2.0000 | ORAL_TABLET | Freq: Every day | ORAL | 3 refills | Status: DC

## 2017-09-25 NOTE — Patient Instructions (Addendum)
Stop taking your Toprol  Increase your lisinopril-HCTZ to 2 tablets each day  Recheck lab work and blood pressure in 1 week -Wednesday October 10th at 8:30am

## 2017-09-25 NOTE — Progress Notes (Signed)
Patient ID: [name]                 DOB: April 10, 1951                      MRN: [id number]     HPI: [name] is a 66 y.o. female referred by Dr. Tamala Julian to HTN clinic. PMH is significant for LVH, frequent PVCs, HLD, and HTN. Prior negative workup for coronary disease with low risk myocardial perfusion study February 2015. Echocardiogram in 2015 demonstrated moderate LVH. Pt was first evaluated by Dr Tamala Julian on 09/09/17 and was started on Toprol 50mg  daily due to elevated BP of 170/94.  Pt reports feeling well overall. She denies dizziness, blurred vision, falls, or headache. It did take a few days for her to adjust to the metoprolol, she felt "woozy" at first. She took her medications about 30 minutes before coming in for her visit today. She has checked her BP at home a few times and notes systolic readings at home typically 150-170s, although her cuff reads error occasionally (likely due to elevated readings). BMET stable in July 2018.  BP very elevated in clinic today at 220/86. Pt given clonidine 0.2mg  in clinic. F/u BP clinic improved to 200/90. Checked again at 194/90. Pt denies symptoms and is aware of s/sx of stroke. Of note, her BP has historically run this high in clinic. Discussed with Dr Tamala Julian in office, ok to send pt home.  Current HTN meds: lisinopril-HCTZ 20-12.5mg  daily, Toprol 50mg  daily BP goal: <140/76mmHg per Dr Tamala Julian  Family History: 2 brothers and her mother had heart attacks and heart failure.  Social History: Former smoker for 5 years, quit in 1990. Denies alcohol and illicit drug use.  Diet: Likes fruit and greens. Tries to limit salt intake. Rarely has caffeine. Does drink ~3 sodas each week.  Exercise: Walks frequently for work at [employer].  Home BP readings: 540-086 systolic  Wt Readings from Last 3 Encounters:  09/09/17 175 lb 6.4 oz (79.6 kg)  07/10/17 172 lb 3.2 oz (78.1 kg)  10/06/15 170 lb (77.1 kg)   BP Readings from Last 3 Encounters:    09/09/17 (!) 170/94  07/10/17 (!) 210/92  10/06/15 189/66   Pulse Readings from Last 3 Encounters:  09/09/17 65  07/10/17 (!) 57  10/06/15 68    Renal function: CrCl cannot be calculated (Patient's most recent lab result is older than the maximum 21 days allowed.).  Past Medical History:  Diagnosis Date  . Hypertension     Current Outpatient Prescriptions on File Prior to Visit  Medication Sig Dispense Refill  . aspirin 81 MG EC tablet TAKE 1 TABLET (81 MG TOTAL) BY MOUTH DAILY. 90 tablet 3  . lisinopril-hydrochlorothiazide (PRINZIDE,ZESTORETIC) 20-12.5 MG tablet Take 1 tablet by mouth daily. 90 tablet 3  . metoprolol succinate (TOPROL-XL) 50 MG 24 hr tablet Take 1 tablet (50 mg total) by mouth daily. Take with or immediately following a meal. 90 tablet 3  . pravastatin (PRAVACHOL) 40 MG tablet Take 1 tablet (40 mg total) by mouth daily. 30 tablet 12   No current facility-administered medications on file prior to visit.     Allergies  Allergen Reactions  . Lipitor [Atorvastatin]     Sick feeling, cramping, swelling     Assessment/Plan:  1. Hypertension - BP extremely elevated in clinic today at 220/86. Pt was given 0.2mg  of clonidine in clinic and BP improved to 194/90. Will increase lisinopril-HCTZ to  40-25mg  daily. Will d/c Toprol due to bradycardia in the 40s since starting therapy. Pt counseled on s/sx of stroke. Discussed plan with Dr Tamala Julian in office today. F/u BMET and BP in 1 week. Will plan to start amlodipine at that time for additional BP lowering.  [name], PharmD, CPP, [facility] [address], [place], [place] [address] Phone: [phone]; Fax: [phone] 09/25/2017 9:44 AM

## 2017-09-27 ENCOUNTER — Emergency Department (HOSPITAL_COMMUNITY): Payer: Medicare HMO

## 2017-09-27 ENCOUNTER — Other Ambulatory Visit: Payer: Self-pay

## 2017-09-27 ENCOUNTER — Encounter (HOSPITAL_COMMUNITY): Payer: Self-pay | Admitting: *Deleted

## 2017-09-27 ENCOUNTER — Emergency Department (HOSPITAL_COMMUNITY)
Admission: EM | Admit: 2017-09-27 | Discharge: 2017-09-27 | Disposition: A | Discharge status: Home / Self Care | Payer: Medicare HMO | Attending: Emergency Medicine | Admitting: Emergency Medicine

## 2017-09-27 DIAGNOSIS — Z79899 Other long term (current) drug therapy: Secondary | ICD-10-CM | POA: Diagnosis not present

## 2017-09-27 DIAGNOSIS — Z87891 Personal history of nicotine dependence: Secondary | ICD-10-CM | POA: Insufficient documentation

## 2017-09-27 DIAGNOSIS — E876 Hypokalemia: Secondary | ICD-10-CM

## 2017-09-27 DIAGNOSIS — I10 Essential (primary) hypertension: Secondary | ICD-10-CM | POA: Diagnosis not present

## 2017-09-27 DIAGNOSIS — R079 Chest pain, unspecified: Secondary | ICD-10-CM | POA: Diagnosis not present

## 2017-09-27 DIAGNOSIS — Z7982 Long term (current) use of aspirin: Secondary | ICD-10-CM | POA: Insufficient documentation

## 2017-09-27 LAB — I-STAT TROPONIN, ED
TROPONIN I, POC: 0.01 ng/mL (ref 0.00–0.08)
Troponin i, poc: 0.01 ng/mL (ref 0.00–0.08)

## 2017-09-27 LAB — CBC
HEMATOCRIT: 36.4 % (ref 36.0–46.0)
HEMOGLOBIN: 12.4 g/dL (ref 12.0–15.0)
MCH: 29.3 pg (ref 26.0–34.0)
MCHC: 34.1 g/dL (ref 30.0–36.0)
MCV: 86.1 fL (ref 78.0–100.0)
Platelets: 189 10*3/uL (ref 150–400)
RBC: 4.23 MIL/uL (ref 3.87–5.11)
RDW: 14.1 % (ref 11.5–15.5)
WBC: 5.8 10*3/uL (ref 4.0–10.5)

## 2017-09-27 LAB — MAGNESIUM: MAGNESIUM: 2.1 mg/dL (ref 1.7–2.4)

## 2017-09-27 MED ORDER — CLONIDINE HCL 0.2 MG PO TABS
0.2000 mg | ORAL_TABLET | Freq: Every day | ORAL | Status: DC
  Administered 2017-09-27: 0.2 mg via ORAL
  Filled 2017-09-27: qty 1

## 2017-09-27 MED ORDER — LISINOPRIL 20 MG PO TABS
40.0000 mg | ORAL_TABLET | Freq: Every day | ORAL | Status: DC
  Administered 2017-09-27: 40 mg via ORAL
  Filled 2017-09-27: qty 2

## 2017-09-27 MED ORDER — POTASSIUM CHLORIDE CRYS ER 20 MEQ PO TBCR: 20.0000 meq | EXTENDED_RELEASE_TABLET | Freq: Two times a day (BID) | ORAL | 0 refills | Status: DC

## 2017-09-27 MED ORDER — HYDROCHLOROTHIAZIDE 25 MG PO TABS
25.0000 mg | ORAL_TABLET | Freq: Every day | ORAL | Status: DC
  Administered 2017-09-27: 25 mg via ORAL
  Filled 2017-09-27: qty 1

## 2017-09-27 MED ORDER — LISINOPRIL-HYDROCHLOROTHIAZIDE 20-12.5 MG PO TABS
2.0000 | ORAL_TABLET | Freq: Every day | ORAL | Status: DC
Start: 2017-09-27 — End: 2017-09-27

## 2017-09-27 MED ORDER — POTASSIUM CHLORIDE CRYS ER 20 MEQ PO TBCR
40.0000 meq | EXTENDED_RELEASE_TABLET | Freq: Once | ORAL | Status: AC
  Administered 2017-09-27: 40 meq via ORAL
  Filled 2017-09-27: qty 2

## 2017-09-27 NOTE — ED Notes (Signed)
Pt verbalized understanding of d/c instructions and has no further questions. Pt stable, VSS, NAD. Removed all belongings from room.

## 2017-09-27 NOTE — ED Provider Notes (Signed)
[facility] DEPT Provider Note   CSN: [id number] Arrival date & time: 09/27/17  6433     History   Chief Complaint Chief Complaint  Patient presents with  . Chest Pain    HPI [name] is a 66 y.o. female.  Resents for evaluation of "feeling woozy."  She noticed this feeling about 4 a.m., when she happened to wake up.  Since then she has also noticed some intermittent tingling in her right arm.  She did not take her blood pressure medicine this morning, because she inadvertently left it at her job.  She has been following closely with cardiology and clinic.  At that time her lisinopril/HCTZ dose was doubled.  She took this new dose yesterday.  Also, she had a cardiac monitor on which was removed yesterday, to follow her bradycardia.  When she was seen 2 days ago, her heart rate was in the 40s so her metoprolol was discontinued.  She denies shortness of breath, nausea, vomiting, headache, back pain, focal weakness or paresthesia.  She is not having any problems walking today.  There are no other known modifying factors.  HPI  Past Medical History:  Diagnosis Date  . Hypertension     Patient Active Problem List   Diagnosis Date Noted  . PVC's (premature ventricular contractions) 07/10/2017  . Abnormal EKG 01/15/2014  . Hypertensive urgency 01/10/2014  . VENTRICULAR HYPERTROPHY, LEFT 05/30/2009  . HYPERCHOLESTEROLEMIA 02/20/2007  . HYPERTENSION, BENIGN SYSTEMIC 02/20/2007  . HEARTBURN 02/20/2007    Past Surgical History:  Procedure Laterality Date  . ABDOMINAL HYSTERECTOMY      OB History    No data available       Home Medications    Prior to Admission medications   Medication Sig Start Date End Date Taking? Authorizing Provider  aspirin 81 MG EC tablet TAKE 1 TABLET (81 MG TOTAL) BY MOUTH DAILY.    Zenia Resides, MD  lisinopril-hydrochlorothiazide (PRINZIDE,ZESTORETIC) 20-12.5 MG tablet Take 2 tablets by mouth daily. 09/25/17   Belva Crome, MD    potassium chloride SA (K-DUR,KLOR-CON) 20 MEQ tablet Take 1 tablet (20 mEq total) by mouth 2 (two) times daily. 09/27/17   Daleen Bo, MD  pravastatin (PRAVACHOL) 40 MG tablet Take 1 tablet (40 mg total) by mouth daily. 07/11/17   Zenia Resides, MD    Family History No family history on file.  Social History Social History  Substance Use Topics  . Smoking status: Former Smoker    Years: 5.00    Quit date: 08/16/1989  . Smokeless tobacco: Never Used  . Alcohol use No     Allergies   Lipitor [atorvastatin]   Review of Systems Review of Systems  All other systems reviewed and are negative.    Physical Exam Updated Vital Signs BP (!) 150/80   Pulse 67   Temp 98.3 F (36.8 C) (Oral)   Resp 12   Ht 5\' 1"  (1.549 m)   Wt 79.4 kg (175 lb)   SpO2 99%   BMI 33.07 kg/m   Physical Exam  Constitutional: She is oriented to person, place, and time. She appears well-developed.  Overweight.  HENT:  Head: Normocephalic and atraumatic.  Eyes: Pupils are equal, round, and reactive to light. Conjunctivae and EOM are normal.  Neck: Normal range of motion and phonation normal. Neck supple.  Cardiovascular: Normal rate.   Irregular heartbeat.  Pulmonary/Chest: Effort normal and breath sounds normal. She exhibits no tenderness.  Abdominal: Soft. She exhibits no distension.  There is no tenderness. There is no guarding.  Musculoskeletal: Normal range of motion.  Neurological: She is alert and oriented to person, place, and time. She exhibits normal muscle tone.  No dysarthria, aphasia or nystagmus.  Normal sensation arms, hands, legs bilaterally.  Skin: Skin is warm and dry.  Psychiatric: She has a normal mood and affect. Her behavior is normal. Judgment and thought content normal.  Nursing note and vitals reviewed.    ED Treatments / Results  Labs (all labs ordered are listed, but only abnormal results are displayed) Labs Reviewed  BASIC METABOLIC PANEL - Abnormal;  Notable for the following:       Result Value   Potassium 2.6 (*)    Chloride 100 (*)    Glucose, Bld 115 (*)    All other components within normal limits  CBC  MAGNESIUM  I-STAT TROPONIN, ED  I-STAT TROPONIN, ED    EKG  EKG Interpretation  Date/Time:  Friday September 27 2017 06:26:39 EDT Ventricular Rate:  62 PR Interval:  166 QRS Duration: 84 QT Interval:  432 QTC Calculation: 438 R Axis:   -5 Text Interpretation:  Sinus rhythm with occasional Premature ventricular complexes Left ventricular hypertrophy with repolarization abnormality Abnormal ECG since last tracing no significant change Confirmed by Daleen Bo [phone]) on 09/27/2017 8:27:07 AM       Radiology Dg Chest 2 View  Result Date: 09/27/2017 CLINICAL DATA:  Chest pain EXAM: CHEST  2 VIEW COMPARISON:  01/10/2014 FINDINGS: Cardiac shadow is mildly enlarged. The lungs are well aerated bilaterally stable interstitial changes. No focal infiltrate or sizable effusion is seen. No acute bony abnormality is noted. Calcified granuloma is noted anteriorly on the lateral projection. IMPRESSION: No acute abnormality noted. Electronically Signed   By: Inez Catalina M.D.   On: 09/27/2017 06:57    Procedures Procedures (including critical care time)  Medications Ordered in ED Medications  cloNIDine (CATAPRES) tablet 0.2 mg (0.2 mg Oral Given 09/27/17 0847)  lisinopril (PRINIVIL,ZESTRIL) tablet 40 mg (40 mg Oral Given 09/27/17 0845)    And  hydrochlorothiazide (HYDRODIURIL) tablet 25 mg (25 mg Oral Given 09/27/17 0845)  potassium chloride SA (K-DUR,KLOR-CON) CR tablet 40 mEq (not administered)     Initial Impression / Assessment and Plan / ED Course  I have reviewed the triage vital signs and the nursing notes.  Pertinent labs & imaging results that were available during my care of the patient were reviewed by me and considered in my medical decision making (see chart for details).      Patient Vitals for the past 24  hrs:  BP Temp Temp src Pulse Resp SpO2 Height Weight  09/27/17 1015 (!) 150/80 - - 67 12 99 % - -  09/27/17 0945 (!) 217/76 - - (!) 48 15 100 % - -  09/27/17 0930 (!) 212/90 - - (!) 41 14 99 % - -  09/27/17 0915 (!) 237/82 - - (!) 59 19 98 % - -  09/27/17 0900 (!) 246/116 - - (!) 56 (!) 26 100 % - -  09/27/17 0845 (!) 237/78 - - (!) 52 (!) 25 100 % - -  09/27/17 0815 (!) 246/102 - - 60 (!) 26 100 % - -  09/27/17 0808 (!) 269/112 - - 70 15 96 % - -  09/27/17 0632 (!) 238/101 - - - - - - -  09/27/17 0630 - - - - - - 5\' 1"  (1.549 m) 79.4 kg (175 lb)  09/27/17 0240 -  98.3 F (36.8 C) Oral 66 18 97 % - -    At discharge- reevaluation with update and discussion. After initial assessment and treatment, an updated evaluation reveals patient remains comfortable, and has no further complaints.  Findings discussed and questions answered. Ryoma Nofziger L      Final Clinical Impressions(s) / ED Diagnoses   Final diagnoses:  Hypertension, unspecified type  Hypokalemia   Malaise with incidental hypokalemia and hypertension.  Doubt metabolic or vascular instability.  Doubt serious bacterial infection.  Nursing Notes Reviewed/ Care Coordinated Applicable Imaging Reviewed Interpretation of Laboratory Data incorporated into ED treatment  The patient appears reasonably screened and/or stabilized for discharge and I doubt any other medical condition or other [facility] requiring further screening, evaluation, or treatment in the ED at this time prior to discharge.  Plan: Home Medications-continue usual medications; Home Treatments-rest, fluids, high potassium diet; return here if the recommended treatment, does not improve the symptoms; Recommended follow up-PCP checkup 1 week for repeat lab testing    New Prescriptions New Prescriptions   POTASSIUM CHLORIDE SA (K-DUR,KLOR-CON) 20 MEQ TABLET    Take 1 tablet (20 mEq total) by mouth 2 (two) times daily.     Daleen Bo, MD 09/30/17 [phone]

## 2017-09-27 NOTE — ED Triage Notes (Signed)
Patient stated she started with some COP yesterday but noticed it again today.  States the pain is under the left breast and into the center of her chest.  BP elevated this AM  States she has not taken her AM meds yet

## 2017-09-27 NOTE — Discharge Instructions (Signed)
Take your medication for blood pressure as directed.  We are giving you a prescription for potassium since the potassium was low today.  By improving your potassium level you will probably feel much better.  Try to eat foods which contain plenty of potassium.  Have your doctor recheck your potassium level next week when you see him.

## 2017-09-28 LAB — BASIC METABOLIC PANEL
ANION GAP: 7 (ref 5–15)
BUN: 19 mg/dL (ref 6–20)
CO2: 29 mmol/L (ref 22–32)
Calcium: 9.3 mg/dL (ref 8.9–10.3)
Chloride: 100 mmol/L — ABNORMAL LOW (ref 101–111)
Creatinine, Ser: 0.96 mg/dL (ref 0.44–1.00)
GFR calc Af Amer: >60 mL/min (ref >60)
GFR calc non Af Amer: >60 mL/min (ref >60)
GLUCOSE: 115 mg/dL — AB (ref 65–99)
POTASSIUM: 2.6 mmol/L — AB (ref 3.5–5.1)
Sodium: 136 mmol/L (ref 135–145)

## 2017-10-02 ENCOUNTER — Other Ambulatory Visit: Payer: Medicare HMO | Admitting: *Deleted

## 2017-10-02 ENCOUNTER — Ambulatory Visit (INDEPENDENT_AMBULATORY_CARE_PROVIDER_SITE_OTHER): Payer: Medicare HMO | Admitting: Pharmacist

## 2017-10-02 ENCOUNTER — Encounter: Payer: Self-pay | Admitting: Pharmacist

## 2017-10-02 VITALS — BP 185/72 | HR 61

## 2017-10-02 DIAGNOSIS — I10 Essential (primary) hypertension: Secondary | ICD-10-CM

## 2017-10-02 LAB — BASIC METABOLIC PANEL
BUN/Creatinine Ratio: 22 (ref 12–28)
BUN: 21 mg/dL (ref 8–27)
CALCIUM: 9.3 mg/dL (ref 8.7–10.3)
CO2: 24 mmol/L (ref 20–29)
CREATININE: 0.95 mg/dL (ref 0.57–1.00)
Chloride: 99 mmol/L (ref 96–106)
GFR calc Af Amer: 73 mL/min/{1.73_m2} (ref >59)
GFR, EST NON AFRICAN AMERICAN: 63 mL/min/{1.73_m2} (ref >59)
GLUCOSE: 100 mg/dL — AB (ref 65–99)
Potassium: 4.4 mmol/L (ref 3.5–5.2)
Sodium: 138 mmol/L (ref 134–144)

## 2017-10-02 MED ORDER — SPIRONOLACTONE 25 MG PO TABS: 25.0000 mg | ORAL_TABLET | Freq: Every day | ORAL | 3 refills | Status: DC

## 2017-10-02 NOTE — Patient Instructions (Signed)
It was nice to see you today  Continue taking 2 tablets of your lisinopril-HCTZ today  We will check your lab work today and depending on how your potassium looks, we will either start spironolactone or amlodipine  Follow up in clinic in 2 weeks

## 2017-10-02 NOTE — Addendum Note (Signed)
Addended by: SUPPLE, MEGAN E on: 10/02/2017 04:20 PM   Modules accepted: Orders

## 2017-10-02 NOTE — Progress Notes (Addendum)
Patient ID: [name]                 DOB: [birth year]                      MRN: [id number]     [name] is a 66 y.o. female referred by Dr. Tamala Julian to HTN clinic. PMH is significant for LVH, frequent PVCs, HLD, and HTN. Prior negative workup for coronary disease with low risk myocardial perfusion study February 2015. Echocardiogram in 2015 demonstrated moderate LVH. Pt was first evaluated by Dr Tamala Julian on 09/09/17 and was started on Toprol 50mg  daily due to elevated BP of 170/94. At follow up in HTN clinic 1 week ago, pt was extremely elevated at 220/86. Pt was given clonidine in clinic and her BP improved to 194/90. Her lisinopril-HCTZ was increased to 40-25mg  and her Toprol was discontinued due to bradycardia in the 40s. She went to the ED 2 days later with reports of feeling "woozy" and intermittent tingling in her right arm. Her BP was 150/80 but she was found to be hypokalemic with K of 2.6. She was started on potassium supplementation.  Pt reports feeling much better overall. She denies dizziness, falls, blurred vision, or headaches. She has been adherent to her medications including her new potassium supplements and has been eating foods high in potassium. Her systolic BP at home has remained elevated in the 160-170s. She took her blood pressure medication about an hour ago and has not had any caffeine yet.  Current HTN meds: lisinopril-HCTZ 40-25mg  daily Previously tried: Toprol - bradycardic BP goal: <140/25mmHg per Dr Tamala Julian  Family History: 2 brothers and her mother had heart attacks and heart failure.  Social History: Former smoker for 5 years, quit in 1990. Denies alcohol and illicit drug use.  Diet: Likes fruit and greens. Tries to limit salt intake. Rarely has caffeine. Does drink ~3 sodas each week.  Exercise: Walks frequently for work at [employer].  Home BP readings: 202-542 systolic  Wt Readings from Last 3 Encounters:  09/27/17 175 lb (79.4 kg)  09/09/17  175 lb 6.4 oz (79.6 kg)  07/10/17 172 lb 3.2 oz (78.1 kg)   BP Readings from Last 3 Encounters:  09/27/17 (!) 153/89  09/25/17 (!) 194/90  09/09/17 (!) 170/94   Pulse Readings from Last 3 Encounters:  09/27/17 (!) 44  09/25/17 (!) 45  09/09/17 65    Renal function: Estimated Creatinine Clearance: 55.7 mL/min (by C-G formula based on SCr of 0.96 mg/dL).  Past Medical History:  Diagnosis Date  . Hypertension     Current Outpatient Prescriptions on File Prior to Visit  Medication Sig Dispense Refill  . aspirin 81 MG EC tablet TAKE 1 TABLET (81 MG TOTAL) BY MOUTH DAILY. 90 tablet 3  . lisinopril-hydrochlorothiazide (PRINZIDE,ZESTORETIC) 20-12.5 MG tablet Take 2 tablets by mouth daily. 180 tablet 3  . potassium chloride SA (K-DUR,KLOR-CON) 20 MEQ tablet Take 1 tablet (20 mEq total) by mouth 2 (two) times daily. 14 tablet 0  . pravastatin (PRAVACHOL) 40 MG tablet Take 1 tablet (40 mg total) by mouth daily. 30 tablet 12   Current Facility-Administered Medications on File Prior to Visit  Medication Dose Route Frequency Provider Last Rate Last Dose  . cloNIDine (CATAPRES) tablet 0.2 mg  0.2 mg Oral Once Belva Crome, MD        Allergies  Allergen Reactions  . Lipitor [Atorvastatin]     Sick feeling, cramping, swelling  Assessment/Plan:  1. Hypertension - Systolic BP improved by ~72IOMB since visit 1 week ago, however BP still remains very elevated at 185/10mmHg. Pt is tolerating higher dose of lisinopril-HCTZ 40-25mg  daily. Will check BMET today with recent dose increase as well as potassium supplement start (K 2.6 in ED last week). Depending on BMET results, will start either spironolactone or amlodipine with close f/u in HTN clinic in 2 weeks.    Sierrah Luevano E. Gurpreet Mikhail, PharmD, CPP, [facility] [address], [place], [place] [address] Phone: [phone]; Fax: [phone] 10/02/2017 8:31 AM  Addendum: BMET stable and K improved to 4.4. Will  start spironolactone 25mg  daily and cut back on potassium to 1 tablet daily. Recheck BMET in 1 week. Pt is aware of lab results and medication plan.

## 2017-10-09 ENCOUNTER — Other Ambulatory Visit: Payer: Medicare HMO | Admitting: *Deleted

## 2017-10-09 DIAGNOSIS — I10 Essential (primary) hypertension: Secondary | ICD-10-CM | POA: Diagnosis not present

## 2017-10-09 LAB — BASIC METABOLIC PANEL
BUN/Creatinine Ratio: 27 (ref 12–28)
BUN: 28 mg/dL — AB (ref 8–27)
CO2: 20 mmol/L (ref 20–29)
CREATININE: 1.02 mg/dL — AB (ref 0.57–1.00)
Calcium: 9.5 mg/dL (ref 8.7–10.3)
Chloride: 103 mmol/L (ref 96–106)
GFR calc Af Amer: 67 mL/min/{1.73_m2} (ref >59)
GFR, EST NON AFRICAN AMERICAN: 58 mL/min/{1.73_m2} — AB (ref >59)
Glucose: 92 mg/dL (ref 65–99)
Potassium: 5.1 mmol/L (ref 3.5–5.2)
SODIUM: 135 mmol/L (ref 134–144)

## 2017-10-10 ENCOUNTER — Telehealth: Payer: Self-pay | Admitting: Pharmacist

## 2017-10-10 NOTE — Telephone Encounter (Signed)
Pt started on KCl 99meq daily after K of 2.6 noted in the ED 2 weeks ago. K normalized on BMET check 1 week ago. At that time, pt was hypertensive - she was started on spironolactone 25mg  daily and KCl was decreased to 62meq daily. Since K has now increased to 5.1, called pt and advised her to d/c her K supplementation completely and continue her spironolactone. She verbalized understanding and will keep f/u in HTN clinic next week.

## 2017-10-14 ENCOUNTER — Encounter: Payer: Self-pay | Admitting: Pharmacist

## 2017-10-14 ENCOUNTER — Ambulatory Visit (INDEPENDENT_AMBULATORY_CARE_PROVIDER_SITE_OTHER): Payer: Medicare HMO | Admitting: Pharmacist

## 2017-10-14 VITALS — BP 152/88 | HR 65

## 2017-10-14 DIAGNOSIS — I10 Essential (primary) hypertension: Secondary | ICD-10-CM

## 2017-10-14 MED ORDER — SPIRONOLACTONE 50 MG PO TABS: 50.0000 mg | ORAL_TABLET | Freq: Every day | ORAL | 11 refills | Status: DC

## 2017-10-14 NOTE — Patient Instructions (Addendum)
It was nice to see you today - your blood pressure is improving! We would like it to be less than 140/90.  Increase your spironolactone to 50mg  daily. You can take 2 of your 25mg  tablets each day. When you run out, pick up your new prescription for the 50mg  tablets and start taking 1 each day.  Go back to eating a normal amount of potassium in your diet - you do not need to try to eat more than normal. The spironolactone will help keep this level up.  Recheck labs and blood pressure at your visit with Dr Tamala Julian on 10/31  Look for an over the counter product with guaifenesin to help with your cough

## 2017-10-14 NOTE — Progress Notes (Signed)
Patient ID: [name]                 DOB: 08-28-1951                      MRN: [id number]     HPI: [name] a 66 y.o.femalereferred by Dr. Corey Skains HTN clinic.PMH is significant for LVH, frequent PVCs, HLD, and HTN. Prior negative workup for coronary disease with low risk myocardial perfusion study February 2015. Echocardiogram in 2015 demonstrated moderate LVH. Pt was first evaluated by Dr Tamala Julian on 09/09/17 and was started on Toprol 50mg  daily due to elevated BP of 170/94. At follow up in HTN clinic 1 week ago, pt was extremely elevated at 220/86. Pt was given clonidine in clinic and her BP improved to 194/90. Her lisinopril-HCTZ was increased to 40-25mg  and her Toprol was discontinued due to bradycardia in the 40s. She went to the ED 2 days later with reports of feeling "woozy" and intermittent tingling in her right arm. Her BP was 150/80 but she was found to be hypokalemic with K of 2.6. She was started on potassium supplementation. F/u BP was again elevated at 185/72 and pt was started on spironolactone 25mg  daily and K supplementation was reduced to 22meq daily. BMET 1 week later showed K of 5.1 and pt was advised to d/c K supplementation completely.  Pt reports feeling well overall. Denies dizziness, falls, headache, or blurred vision. She has been tolerating spironolactone 25mg  daily well. She stopped taking her potassium supplement as recommended. Her BP readings at home have improved to 518-841 systolic, compared to previous readings close to 660 systolic. She took her BP medications ~2 hours ago and has not had any caffeine. She has been drinking 1 cup of hibiscus tea each day which she thinks is helping to lower her BP too. She has had a productive cough for a week - recommended she look for an OTC product containing guaifenesin.  Current HTN meds:lisinopril-HCTZ 40-25mg  daily, spironolactone 25mg  daily Previously tried: Toprol - bradycardic BP goal: <140/23mmHg per Dr  Tamala Julian  Family History: 2 brothers and her mother had heart attacks and heart failure.  Social History: Former smoker for 5 years, quit in 1990. Denies alcohol and illicit drug use.  Diet:Likes fruit and greens. Tries to limit salt intake. Rarely has caffeine. Does drink ~3 sodas each week.  Exercise:Walks frequently for work at [employer].  Home BP [id number]:932-355 systolic   Wt Readings from Last 3 Encounters:  09/27/17 175 lb (79.4 kg)  09/09/17 175 lb 6.4 oz (79.6 kg)  07/10/17 172 lb 3.2 oz (78.1 kg)   BP Readings from Last 3 Encounters:  10/02/17 (!) 185/72  09/27/17 (!) 153/89  09/25/17 (!) 194/90   Pulse Readings from Last 3 Encounters:  10/02/17 61  09/27/17 (!) 44  09/25/17 (!) 45    Renal function: CrCl cannot be calculated (Unknown ideal weight.).  Past Medical History:  Diagnosis Date  . Hypertension     Current Outpatient Prescriptions on File Prior to Visit  Medication Sig Dispense Refill  . aspirin 81 MG EC tablet TAKE 1 TABLET (81 MG TOTAL) BY MOUTH DAILY. 90 tablet 3  . lisinopril-hydrochlorothiazide (PRINZIDE,ZESTORETIC) 20-12.5 MG tablet Take 2 tablets by mouth daily. 180 tablet 3  . pravastatin (PRAVACHOL) 40 MG tablet Take 1 tablet (40 mg total) by mouth daily. 30 tablet 12  . spironolactone (ALDACTONE) 25 MG tablet Take 1 tablet (25 mg total) by mouth daily. [place]  tablet 3   No current facility-administered medications on file prior to visit.     Allergies  Allergen Reactions  . Lipitor [Atorvastatin]     Sick feeling, cramping, swelling     Assessment/Plan:  1. Hypertension - BP is much improved from systolic in the 290S at last visit to 152/47mmHg today. She is tolerating increased dose of spironolactone 25mg  daily well. K improved with initiation of spironolactone and reduction of K supplementation which pt was advised to d/c after last BMET showed K of 5.1. Will further increase spironolactone to 50mg  daily and continue  losartan-HCTZ 100-25mg  daily to target BP < 140/65mmHg per Dr Thompson Caul recommendation. She is scheduled to see Dr Tamala Julian next week - will add on BMET to this visit. Can f/u in HTN clinic as needed.   Kody Brandl E. Wallis Spizzirri, PharmD, CPP, [facility] [address], [place], [place] [address] Phone: [phone]; Fax: [phone] 10/14/2017 8:28 AM

## 2017-10-23 ENCOUNTER — Other Ambulatory Visit: Payer: Medicare HMO | Admitting: *Deleted

## 2017-10-23 ENCOUNTER — Encounter: Payer: Self-pay | Admitting: Interventional Cardiology

## 2017-10-23 ENCOUNTER — Ambulatory Visit (INDEPENDENT_AMBULATORY_CARE_PROVIDER_SITE_OTHER): Payer: Medicare HMO | Admitting: Interventional Cardiology

## 2017-10-23 VITALS — BP 130/74 | HR 53 | Ht 60.0 in | Wt 170.8 lb

## 2017-10-23 DIAGNOSIS — I10 Essential (primary) hypertension: Secondary | ICD-10-CM | POA: Diagnosis not present

## 2017-10-23 DIAGNOSIS — I493 Ventricular premature depolarization: Secondary | ICD-10-CM | POA: Diagnosis not present

## 2017-10-23 LAB — BASIC METABOLIC PANEL
BUN / CREAT RATIO: 27 (ref 12–28)
BUN: 33 mg/dL — ABNORMAL HIGH (ref 8–27)
CO2: 24 mmol/L (ref 20–29)
CREATININE: 1.24 mg/dL — AB (ref 0.57–1.00)
Calcium: 9.7 mg/dL (ref 8.7–10.3)
Chloride: 100 mmol/L (ref 96–106)
GFR calc Af Amer: 53 mL/min/{1.73_m2} — ABNORMAL LOW (ref >59)
GFR calc non Af Amer: 46 mL/min/{1.73_m2} — ABNORMAL LOW (ref >59)
GLUCOSE: 103 mg/dL — AB (ref 65–99)
POTASSIUM: 4.5 mmol/L (ref 3.5–5.2)
SODIUM: 139 mmol/L (ref 134–144)

## 2017-10-23 NOTE — Patient Instructions (Signed)

## 2017-10-23 NOTE — Progress Notes (Signed)
Cardiology Office Note    Date:  10/23/2017   ID:  [name], DOB [birth year], MRN [id number]  PCP:  Zenia Resides, MD  Cardiologist: Sinclair Grooms, MD   Chief Complaint  Patient presents with  . Follow-up    Hypertension    History of Present Illness:  [name] is a 66 y.o. female is referred by Dr. Madison Hickman because of EKG demonstrating LVH and frequent PVCs. Prior negative workup for coronary disease with low risk myocardial perfusion study February 2015. Echocardiogram in 2015 demonstrated moderate LVH.  She feels well.  She has no complaints.  No medication side effects.  Beta-blocker led to marked bradycardia requiring discontinuation.   Past Medical History:  Diagnosis Date  . Hypertension     Past Surgical History:  Procedure Laterality Date  . ABDOMINAL HYSTERECTOMY      Current Medications: Outpatient Medications Prior to Visit  Medication Sig Dispense Refill  . aspirin 81 MG EC tablet TAKE 1 TABLET (81 MG TOTAL) BY MOUTH DAILY. 90 tablet 3  . lisinopril-hydrochlorothiazide (PRINZIDE,ZESTORETIC) 20-12.5 MG tablet Take 2 tablets by mouth daily. 180 tablet 3  . pravastatin (PRAVACHOL) 40 MG tablet Take 1 tablet (40 mg total) by mouth daily. 30 tablet 12  . spironolactone (ALDACTONE) 50 MG tablet Take 1 tablet (50 mg total) by mouth daily. 30 tablet 11   No facility-administered medications prior to visit.      Allergies:   Lipitor [atorvastatin]   Social History   Social History  . Marital status: Divorced    Spouse name: N/A  . Number of children: N/A  . Years of education: N/A   Social History Main Topics  . Smoking status: Former Smoker    Years: 5.00    Quit date: 08/16/1989  . Smokeless tobacco: Never Used  . Alcohol use No  . Drug use: No  . Sexual activity: No   Other Topics Concern  . None   Social History Narrative  . None     Family History:  The patient's family history is not on file.   ROS:     Please see the history of present illness.    None All other systems reviewed and are negative.   PHYSICAL EXAM:   VS:  BP 130/74 (BP Location: Left Arm)   Pulse (!) 53   Ht 5' (1.524 m)   Wt 170 lb 12.8 oz (77.5 kg)   BMI 33.36 kg/m    GEN: Well nourished, well developed, in no acute distress  HEENT: normal  Neck: no JVD, carotid bruits, or masses Cardiac: RRR; no murmurs, rubs, or gallops,no edema  Respiratory:  clear to auscultation bilaterally, normal work of breathing GI: soft, nontender, nondistended, + BS MS: no deformity or atrophy  Skin: warm and dry, no rash Neuro:  Alert and Oriented x 3, Strength and sensation are intact Psych: euthymic mood, full affect  Wt Readings from Last 3 Encounters:  10/23/17 170 lb 12.8 oz (77.5 kg)  09/27/17 175 lb (79.4 kg)  09/09/17 175 lb 6.4 oz (79.6 kg)      Studies/Labs Reviewed:   EKG:  EKG  NOT REPEATED.  Recent Labs: 07/10/2017: ALT 9 09/27/2017: Hemoglobin 12.4; Magnesium 2.1; Platelets 189 10/09/2017: BUN 28; Creatinine, Ser 1.02; Potassium 5.1; Sodium 135   Lipid Panel    Component Value Date/Time   CHOL 221 (H) 07/10/2017 1424   TRIG 74 07/10/2017 1424   HDL 45 07/10/2017 1424   CHOLHDL  4.9 (H) 07/10/2017 1424   CHOLHDL 4.3 01/11/2014 0250   VLDL 11 01/11/2014 0250   LDLCALC 161 (H) 07/10/2017 1424   LDLDIRECT 140 (H) 08/17/2011 0949    Additional studies/ records that were reviewed today include:   Holter Monitor 09/25/2017 Study Highlights  Addendum by Belva Crome, MD on Wed Oct 16, 2017  3:08 PM     Sinus bradycardia with average HR 48 bpm and range 35-78 bpm  Rare PAC's and PVC's   Sinus bradycardia, without HR > 80 bpm. Consider chronotropic incompetence. Occasional PVC's and PAC's No diary entries   ECHOCARDIOGRAM 09/2017: Study Conclusions   - Left ventricle: The cavity size was normal. Wall thickness was   normal. Systolic function was normal. The estimated ejection   fraction was in  the range of 55% to 60%. Wall motion was normal;   there were no regional wall motion abnormalities. Doppler   parameters are consistent with abnormal left ventricular   relaxation (grade 1 diastolic dysfunction). - Mitral valve: Calcified annulus. Valve area by pressure   half-time: 1.76 cm^2. - Left atrium: The atrium was mildly dilated. Volume/bsa, ES,   (1-plane Simpson&'s, A2C): 41 ml/m^2.    ASSESSMENT:    1. PVC's (premature ventricular contractions)   2. HYPERTENSION, BENIGN SYSTEMIC      PLAN:  In order of problems listed above:  1. No complaints.  No significant ventricular ectopy noted on event monitor/Holter.  Did have significant bradycardia and beta-blocker therapy was discontinued. 2. Blood pressure is now much better controlled.  She is on an ACE inhibitor plus Aldactone.  Blood work will be done today to rule out azotemia and hyperkalemia.  Echo demonstrated normal LV function without evidence of hypertrophy or systolic dysfunction.  Continue current therapy.  85-month follow-up.    Medication Adjustments/Labs and Tests Ordered: Current medicines are reviewed at length with the patient today.  Concerns regarding medicines are outlined above.  Medication changes, Labs and Tests ordered today are listed in the Patient Instructions below. Patient Instructions  Medication Instructions:  Your physician recommends that you continue on your current medications as directed. Please refer to the Current Medication list given to you today.   Labwork: None  Testing/Procedures: None  Follow-Up: Your physician wants you to follow-up in: 6 months with Dr. Tamala Julian.  You will receive a reminder letter in the mail two months in advance. If you don't receive a letter, please call our office to schedule the follow-up appointment.   Any Other Special Instructions Will Be Listed Below (If Applicable).     If you need a refill on your cardiac medications before your next  appointment, please call your pharmacy.      Signed, Sinclair Grooms, MD  10/23/2017 9:36 AM    [facility] [place], [place], [place]  [address] Phone: [phone]; Fax: [phone]

## 2018-01-22 ENCOUNTER — Emergency Department (HOSPITAL_COMMUNITY): Payer: Medicare HMO

## 2018-01-22 ENCOUNTER — Other Ambulatory Visit: Payer: Self-pay

## 2018-01-22 ENCOUNTER — Encounter (HOSPITAL_COMMUNITY): Payer: Self-pay

## 2018-01-22 ENCOUNTER — Emergency Department (HOSPITAL_COMMUNITY)
Admission: EM | Admit: 2018-01-22 | Discharge: 2018-01-22 | Disposition: A | Discharge status: Home / Self Care | Payer: Medicare HMO | Attending: Emergency Medicine | Admitting: Emergency Medicine

## 2018-01-22 DIAGNOSIS — Y9222 Religious institution as the place of occurrence of the external cause: Secondary | ICD-10-CM | POA: Diagnosis not present

## 2018-01-22 DIAGNOSIS — Z7982 Long term (current) use of aspirin: Secondary | ICD-10-CM | POA: Insufficient documentation

## 2018-01-22 DIAGNOSIS — Y9389 Activity, other specified: Secondary | ICD-10-CM | POA: Diagnosis not present

## 2018-01-22 DIAGNOSIS — Z87891 Personal history of nicotine dependence: Secondary | ICD-10-CM | POA: Diagnosis not present

## 2018-01-22 DIAGNOSIS — Z79899 Other long term (current) drug therapy: Secondary | ICD-10-CM | POA: Insufficient documentation

## 2018-01-22 DIAGNOSIS — W010XXA Fall on same level from slipping, tripping and stumbling without subsequent striking against object, initial encounter: Secondary | ICD-10-CM | POA: Diagnosis not present

## 2018-01-22 DIAGNOSIS — I10 Essential (primary) hypertension: Secondary | ICD-10-CM | POA: Insufficient documentation

## 2018-01-22 DIAGNOSIS — M542 Cervicalgia: Secondary | ICD-10-CM | POA: Diagnosis not present

## 2018-01-22 DIAGNOSIS — S199XXA Unspecified injury of neck, initial encounter: Secondary | ICD-10-CM | POA: Diagnosis not present

## 2018-01-22 DIAGNOSIS — Y990 Civilian activity done for income or pay: Secondary | ICD-10-CM | POA: Insufficient documentation

## 2018-01-22 DIAGNOSIS — Z7902 Long term (current) use of antithrombotics/antiplatelets: Secondary | ICD-10-CM | POA: Diagnosis not present

## 2018-01-22 DIAGNOSIS — S0990XA Unspecified injury of head, initial encounter: Secondary | ICD-10-CM | POA: Insufficient documentation

## 2018-01-22 MED ORDER — ACETAMINOPHEN 325 MG PO TABS
650.0000 mg | ORAL_TABLET | Freq: Once | ORAL | Status: AC
Start: 2018-01-22 — End: 2018-01-22
  Administered 2018-01-22: 650 mg via ORAL
  Filled 2018-01-22: qty 2

## 2018-01-22 NOTE — Discharge Instructions (Signed)
Please read attached information. If you experience any new or worsening signs or symptoms please return to the emergency room for evaluation. Please follow-up with your primary care provider or specialist as discussed.  Please use Tylenol as needed for headache. °

## 2018-01-22 NOTE — ED Notes (Signed)
Patient transported to CT 

## 2018-01-22 NOTE — ED Provider Notes (Signed)
[facility] EMERGENCY DEPARTMENT Provider Note   CSN: [id number] Arrival date & time: 01/22/18  1014     History   Chief Complaint No chief complaint on file.   HPI Tiondra Fang is a 67 y.o. female.  HPI   67 year old female presents status post fall.  Patient notes that she was at church on Sunday approximately 4 days ago.  She notes someone passed out causing her to trip and fall landing backward and hitting the head.  She denies any loss of consciousness, denies any neurological deficits.  She notes some very minimal pain at the upper cervical region.  She notes no complaints until yesterday when she developed a generalized headache, nonfocal.  She notes taking Tylenol for this.  Anticoagulation.  Past Medical History:  Diagnosis Date  . Hypertension     Patient Active Problem List   Diagnosis Date Noted  . PVC's (premature ventricular contractions) 07/10/2017  . Abnormal EKG 01/15/2014  . Hypertensive urgency 01/10/2014  . VENTRICULAR HYPERTROPHY, LEFT 05/30/2009  . HYPERCHOLESTEROLEMIA 02/20/2007  . HYPERTENSION, BENIGN SYSTEMIC 02/20/2007  . HEARTBURN 02/20/2007    Past Surgical History:  Procedure Laterality Date  . ABDOMINAL HYSTERECTOMY      OB History    No data available       Home Medications    Prior to Admission medications   Medication Sig Start Date End Date Taking? Authorizing Provider  aspirin 81 MG EC tablet TAKE 1 TABLET (81 MG TOTAL) BY MOUTH DAILY.    Zenia Resides, MD  KLOR-CON M20 20 MEQ tablet Take 20 mEq by mouth daily. 09/27/17   [provider]  lisinopril-hydrochlorothiazide (PRINZIDE,ZESTORETIC) 20-12.5 MG tablet Take 2 tablets by mouth daily. 09/25/17   Belva Crome, MD  pravastatin (PRAVACHOL) 40 MG tablet Take 1 tablet (40 mg total) by mouth daily. 07/11/17   Zenia Resides, MD  spironolactone (ALDACTONE) 50 MG tablet Take 1 tablet (50 mg total) by mouth daily. 10/14/17 01/12/18  Belva Crome, MD    Family History No family history on file.  Social History Social History   Tobacco Use  . Smoking status: Former Smoker    Years: 5.00    Last attempt to quit: 08/16/1989    Years since quitting: 28.4  . Smokeless tobacco: Never Used  Substance Use Topics  . Alcohol use: No  . Drug use: No     Allergies   Lipitor [atorvastatin]   Review of Systems Review of Systems  All other systems reviewed and are negative.    Physical Exam Updated Vital Signs BP (!) 149/79   Pulse 60   Temp 97.6 F (36.4 C) (Oral)   Resp 18   SpO2 100%   Physical Exam  Constitutional: She is oriented to person, place, and time. She appears well-developed and well-nourished.  HENT:  Head: Normocephalic and atraumatic.  Head is atraumatic with no hematoma, no bleeding  Eyes: Conjunctivae are normal. Pupils are equal, round, and reactive to light. Right eye exhibits no discharge. Left eye exhibits no discharge. No scleral icterus.  Neck: Normal range of motion. No JVD present. No tracheal deviation present.  Pulmonary/Chest: Effort normal. No stridor.  Musculoskeletal:  Minor tenderness to palpation of the upper cervical midline and lower left cervical musculature no T or L-spine tenderness bilateral upper and lower sensation strength and motor function intact  Neurological: She is alert and oriented to person, place, and time. No cranial nerve deficit or sensory deficit.  She exhibits normal muscle tone. Coordination normal.  Psychiatric: She has a normal mood and affect. Her behavior is normal. Judgment and thought content normal.  Nursing note and vitals reviewed.    ED Treatments / Results  Labs (all labs ordered are listed, but only abnormal results are displayed) Labs Reviewed - No data to display  EKG  EKG Interpretation None       Radiology Ct Head Wo Contrast  Result Date: 01/22/2018 CLINICAL DATA:  Fall several days ago with posterior head injury and neck pain,  initial encounter EXAM: CT HEAD WITHOUT CONTRAST CT CERVICAL SPINE WITHOUT CONTRAST TECHNIQUE: Multidetector CT imaging of the head and cervical spine was performed following the standard protocol without intravenous contrast. Multiplanar CT image reconstructions of the cervical spine were also generated. COMPARISON:  10/06/2015. FINDINGS: CT HEAD FINDINGS Brain: No evidence of acute infarction, hemorrhage, hydrocephalus, extra-axial collection or mass lesion/mass effect. Vascular: No hyperdense vessel or unexpected calcification. Skull: Normal. Negative for fracture or focal lesion. Sinuses/Orbits: Mucosal retention cysts are noted within the right maxillary antrum. Other: None. CT CERVICAL SPINE FINDINGS Alignment: Within normal limits. Skull base and vertebrae: 7 cervical segments are well visualized. Vertebral body height is well maintained. Mild osteophytic changes are noted at C6-7. No anterolisthesis is seen. No acute fracture or acute facet abnormality is noted. Soft tissues and spinal canal: No prevertebral fluid or swelling. No visible canal hematoma. Upper chest: Negative. Other: None IMPRESSION: CT of the head: No acute intracranial abnormality noted. CT of the cervical spine: Mild degenerative change without acute abnormality. Electronically Signed   By: Inez Catalina M.D.   On: 01/22/2018 13:37   Ct Cervical Spine Wo Contrast  Result Date: 01/22/2018 CLINICAL DATA:  Fall several days ago with posterior head injury and neck pain, initial encounter EXAM: CT HEAD WITHOUT CONTRAST CT CERVICAL SPINE WITHOUT CONTRAST TECHNIQUE: Multidetector CT imaging of the head and cervical spine was performed following the standard protocol without intravenous contrast. Multiplanar CT image reconstructions of the cervical spine were also generated. COMPARISON:  10/06/2015. FINDINGS: CT HEAD FINDINGS Brain: No evidence of acute infarction, hemorrhage, hydrocephalus, extra-axial collection or mass lesion/mass effect.  Vascular: No hyperdense vessel or unexpected calcification. Skull: Normal. Negative for fracture or focal lesion. Sinuses/Orbits: Mucosal retention cysts are noted within the right maxillary antrum. Other: None. CT CERVICAL SPINE FINDINGS Alignment: Within normal limits. Skull base and vertebrae: 7 cervical segments are well visualized. Vertebral body height is well maintained. Mild osteophytic changes are noted at C6-7. No anterolisthesis is seen. No acute fracture or acute facet abnormality is noted. Soft tissues and spinal canal: No prevertebral fluid or swelling. No visible canal hematoma. Upper chest: Negative. Other: None IMPRESSION: CT of the head: No acute intracranial abnormality noted. CT of the cervical spine: Mild degenerative change without acute abnormality. Electronically Signed   By: Inez Catalina M.D.   On: 01/22/2018 13:37    Procedures Procedures (including critical care time)  Medications Ordered in ED Medications  acetaminophen (TYLENOL) tablet 650 mg (650 mg Oral Given 01/22/18 1332)     Initial Impression / Assessment and Plan / ED Course  I have reviewed the triage vital signs and the nursing notes.  Pertinent labs & imaging results that were available during my care of the patient were reviewed by me and considered in my medical decision making (see chart for details).      Final Clinical Impressions(s) / ED Diagnoses   Final diagnoses:  Injury of head, initial  encounter    Labs:   Imaging: CT head without, CT cervical spine  Consults:  Therapeutics:  Discharge Meds:   Assessment/Plan: 67 year old female presents status post fall.  This was 4 days ago recent onset headache.  Nonsevere.  No neurological deficits no acute findings on CT head or neck.  Patient stable for outpatient follow-up and given strict return precautions.  She verbalized understanding and agreement to today's plan had no further questions or concerns at the time of  discharge.      ED Discharge Orders    None       Francee Gentile 01/22/18 1347    Jola Schmidt, MD 01/22/18 1701

## 2018-01-22 NOTE — ED Notes (Signed)
Pt transported to CT ?

## 2018-01-22 NOTE — ED Triage Notes (Signed)
Patient was tripped at church this past Sunday and fell backwards onto carpet striking back of head, no loc. Complains of headache. No blurred vision, no dizziness, no blood thinners

## 2018-05-07 ENCOUNTER — Encounter: Payer: Self-pay | Admitting: Family Medicine

## 2018-05-07 ENCOUNTER — Ambulatory Visit (INDEPENDENT_AMBULATORY_CARE_PROVIDER_SITE_OTHER): Payer: Medicare HMO | Admitting: Family Medicine

## 2018-05-07 ENCOUNTER — Other Ambulatory Visit: Payer: Self-pay

## 2018-05-07 DIAGNOSIS — I10 Essential (primary) hypertension: Secondary | ICD-10-CM | POA: Diagnosis not present

## 2018-05-07 DIAGNOSIS — Z860101 Personal history of adenomatous and serrated colon polyps: Secondary | ICD-10-CM | POA: Insufficient documentation

## 2018-05-07 DIAGNOSIS — Z86010 Personal history of colonic polyps: Secondary | ICD-10-CM | POA: Insufficient documentation

## 2018-05-07 DIAGNOSIS — R252 Cramp and spasm: Secondary | ICD-10-CM

## 2018-05-07 DIAGNOSIS — Z1211 Encounter for screening for malignant neoplasm of colon: Secondary | ICD-10-CM

## 2018-05-07 LAB — POCT GLYCOSYLATED HEMOGLOBIN (HGB A1C): HEMOGLOBIN A1C: 5.2

## 2018-05-07 NOTE — Progress Notes (Signed)
Date of Visit: 05/07/2018   HPI:  [name] is a 67 y.o woman here today to discuss leg cramps.  Leg Cramps - Cramps started about 3 months ago and have gradually been increasing in frequency - Cramping occurs primarily in her calf muscles bilaterally, occasionally in her quadriceps muscles - Episodes tend to occur when she sits for prolonged periods of times (>1 hour) or while she is lying in bed at night; they also occur after prolonged periods of standing or walking - Episodes last up to 15 min and occur ~3x/week - Notices tingling/numbness in her feet with the cramping, but not in isolation; also sometimes notices sensation of "heaviness" in her ankles and swelling of her ankles during episodes - Massaging calves with alcohol helps relieve pain; patient also stands up and moves during episodes to help relieve cramping - Discontinued taking Klor-Con (20 mEq) in October 2018 after visit with cardiologist and concurrent initiation of spironolactone - Denies chest tightness, shortness of breath - Endorses increased thirst and urinary frequency  ROS: See HPI.  PMH: HTN, hypercholesterolemia, LVH  PHYSICAL EXAM: BP (!) 154/68   Pulse (!) 57   Temp 97.9 F (36.6 C) (Oral)   Ht 5\' 1"  (1.549 m)   Wt 170 lb 6.4 oz (77.3 kg)   SpO2 99%   BMI 32.20 kg/m  Gen: Well-appearing, sitting comfortably in chair HEENT:  Heart: RRR, no murmurs/rubs/gallops;  Lungs: CTAB, normal work of breathing Neuro: grossly intact, speech normal Ext: 5/5 strength throughout lower extremities bilaterally; sensation intact; 2+ dorsalis pedis pulse on left, 1+ dorsalis pedis pulse on right  ASSESSMENT/PLAN:  Screening for colon cancer Referral to GI for colonoscopy made.  HYPERTENSION, BENIGN SYSTEMIC Blood pressure elevated in office today, but patient did not take medications in morning because she left house in a rush. (She took them while she was in the waiting room.) Patient returning to clinic for  ABI testing with Dr. Valentina Lucks in 1 week. If BP is elevated at this time will consider adjusting medications to achieve better control.  Leg cramps Uncertain etiology. Cramping at rest and discontinuation of Klor-Con a few months prior to onset of patient's symptoms make hypokalemia a possibility. Hypomagnesemia could also cause cramping at rest. Cramping that occurs with prolonged periods of walking is more suggestive of peripheral artery disease, which is also supported by diminished dorsalis pedis pulse in right foot. Peripheral neuropathy secondary to diabetes could explain tingling/numbness in feet that occurs with cramping, but would be unlikely to cause cramping itself; increased thirst and urinary frequency increase suspicion for DM, although latter could be medication side effect. - Checking BMP, magnesium level, Hb A1c today - Scheduled appointment with Dr. Valentina Lucks for ABI testing next week - Educated patient on potential benefit of gentle stretching exercises to prevent cramping

## 2018-05-07 NOTE — Assessment & Plan Note (Signed)
Blood pressure elevated in office today, but patient did not take medications in morning because she left house in a rush. (She took them while she was in the waiting room.) Patient returning to clinic for ABI testing with Dr. Valentina Lucks in 1 week. If BP is elevated at this time will consider adjusting medications to achieve better control.

## 2018-05-07 NOTE — Assessment & Plan Note (Signed)
Referral to GI for colonoscopy made.

## 2018-05-07 NOTE — Patient Instructions (Signed)
It was great to see you today!  You now have a note to excuse you from jury duty due to your leg cramping.  We would also recommend gentle stretching of your calves, especially before bed to help with the cramping at night. You can also do these stretches periodically throughout the day.  Please schedule an appointment with Dr. Valentina Lucks on your way out for ABIs.  In addition, we sent a new referral for your colonoscopy. They should call you to get this scheduled.

## 2018-05-07 NOTE — Assessment & Plan Note (Addendum)
Uncertain etiology. Cramping at rest and discontinuation of Klor-Con a few months prior to onset of patient's symptoms make hypokalemia a possibility. Hypomagnesemia could also cause cramping at rest. Cramping that occurs with prolonged periods of walking is more suggestive of peripheral artery disease, which is also supported by diminished dorsalis pedis pulse in right foot. Peripheral neuropathy secondary to diabetes could explain tingling/numbness in feet that occurs with cramping, but would be unlikely to cause cramping itself; increased thirst and urinary frequency increase suspicion for DM, although latter could be medication side effect. - Checking BMP, magnesium level, Hb A1c today - Scheduled appointment with Dr. Valentina Lucks for ABI testing next week - Educated patient on potential benefit of gentle stretching exercises to prevent cramping

## 2018-05-08 ENCOUNTER — Encounter: Payer: Self-pay | Admitting: Family Medicine

## 2018-05-08 LAB — BASIC METABOLIC PANEL
BUN/Creatinine Ratio: 21 (ref 12–28)
BUN: 24 mg/dL (ref 8–27)
CO2: 24 mmol/L (ref 20–29)
Calcium: 9.9 mg/dL (ref 8.7–10.3)
Chloride: 104 mmol/L (ref 96–106)
Creatinine, Ser: 1.15 mg/dL — ABNORMAL HIGH (ref 0.57–1.00)
GFR calc Af Amer: 57 mL/min/{1.73_m2} — ABNORMAL LOW (ref >59)
GFR, EST NON AFRICAN AMERICAN: 50 mL/min/{1.73_m2} — AB (ref >59)
GLUCOSE: 94 mg/dL (ref 65–99)
POTASSIUM: 4.4 mmol/L (ref 3.5–5.2)
SODIUM: 142 mmol/L (ref 134–144)

## 2018-05-08 LAB — MAGNESIUM: Magnesium: 2.3 mg/dL (ref 1.6–2.3)

## 2018-05-14 ENCOUNTER — Ambulatory Visit: Payer: Medicare HMO | Admitting: Family Medicine

## 2018-05-26 ENCOUNTER — Ambulatory Visit: Payer: Medicare HMO | Admitting: Pharmacist

## 2018-07-11 ENCOUNTER — Encounter: Payer: Self-pay | Admitting: Family Medicine

## 2018-08-18 DIAGNOSIS — D126 Benign neoplasm of colon, unspecified: Secondary | ICD-10-CM | POA: Diagnosis not present

## 2018-08-18 DIAGNOSIS — Z86010 Personal history of colonic polyps: Secondary | ICD-10-CM | POA: Diagnosis not present

## 2018-08-20 DIAGNOSIS — D126 Benign neoplasm of colon, unspecified: Secondary | ICD-10-CM | POA: Diagnosis not present

## 2018-08-26 ENCOUNTER — Encounter: Payer: Self-pay | Admitting: Family Medicine

## 2018-08-28 ENCOUNTER — Encounter: Payer: Self-pay | Admitting: Family Medicine

## 2018-10-05 ENCOUNTER — Other Ambulatory Visit: Payer: Self-pay | Admitting: Family Medicine

## 2018-10-05 DIAGNOSIS — E7800 Pure hypercholesterolemia, unspecified: Secondary | ICD-10-CM

## 2018-10-19 ENCOUNTER — Other Ambulatory Visit: Payer: Self-pay | Admitting: Interventional Cardiology

## 2018-10-19 DIAGNOSIS — I10 Essential (primary) hypertension: Secondary | ICD-10-CM

## 2018-11-19 ENCOUNTER — Other Ambulatory Visit: Payer: Self-pay | Admitting: Interventional Cardiology

## 2018-11-19 DIAGNOSIS — I10 Essential (primary) hypertension: Secondary | ICD-10-CM

## 2018-12-03 ENCOUNTER — Other Ambulatory Visit: Payer: Self-pay | Admitting: Interventional Cardiology

## 2018-12-03 DIAGNOSIS — I10 Essential (primary) hypertension: Secondary | ICD-10-CM

## 2018-12-03 NOTE — Telephone Encounter (Signed)
Order Providers   Prescribing Provider Encounter Provider  Belva Crome, MD Belva Crome, MD  Outpatient Medication Detail    Disp Refills Start End   lisinopril-hydrochlorothiazide [facility]) 20-12.5 MG tablet 30 tablet 0 11/19/2018    Sig - Route: Take 2 tablets by mouth daily. Patient needs to call and schedule an appointment for further refills 2nd attempt - Oral   Sent to pharmacy as: lisinopril-hydrochlorothiazide ([place]) 20-12.5 MG tablet   E-Prescribing Status: Receipt confirmed by pharmacy (11/19/2018 1:36 PM EST)   Associated Diagnoses   HYPERTENSION, BENIGN SYSTEMIC     Pharmacy   [id number] - [place], [place]

## 2018-12-04 ENCOUNTER — Other Ambulatory Visit: Payer: Self-pay | Admitting: Interventional Cardiology

## 2018-12-04 DIAGNOSIS — I10 Essential (primary) hypertension: Secondary | ICD-10-CM

## 2018-12-15 ENCOUNTER — Other Ambulatory Visit: Payer: Self-pay | Admitting: Interventional Cardiology

## 2018-12-15 DIAGNOSIS — I10 Essential (primary) hypertension: Secondary | ICD-10-CM

## 2018-12-15 MED ORDER — LISINOPRIL-HYDROCHLOROTHIAZIDE 20-12.5 MG PO TABS: 2.0000 | ORAL_TABLET | Freq: Every day | ORAL | 0 refills | Status: DC

## 2019-01-09 ENCOUNTER — Other Ambulatory Visit: Payer: Self-pay | Admitting: Interventional Cardiology

## 2019-01-23 ENCOUNTER — Other Ambulatory Visit: Payer: Self-pay | Admitting: Interventional Cardiology

## 2019-01-26 ENCOUNTER — Other Ambulatory Visit: Payer: Self-pay | Admitting: Interventional Cardiology

## 2019-01-28 MED ORDER — SPIRONOLACTONE 50 MG PO TABS: 50.0000 mg | ORAL_TABLET | Freq: Every day | ORAL | 0 refills | Status: DC

## 2019-01-28 NOTE — Addendum Note (Signed)
Addended by: Derl Barrow on: 01/28/2019 08:18 AM   Modules accepted: Orders

## 2019-05-02 ENCOUNTER — Other Ambulatory Visit: Payer: Self-pay | Admitting: Interventional Cardiology

## 2019-05-17 ENCOUNTER — Other Ambulatory Visit: Payer: Self-pay | Admitting: Interventional Cardiology

## 2019-05-17 DIAGNOSIS — I10 Essential (primary) hypertension: Secondary | ICD-10-CM

## 2019-08-12 ENCOUNTER — Other Ambulatory Visit: Payer: Self-pay | Admitting: Interventional Cardiology

## 2019-08-12 DIAGNOSIS — I10 Essential (primary) hypertension: Secondary | ICD-10-CM

## 2019-09-16 ENCOUNTER — Other Ambulatory Visit: Payer: Self-pay | Admitting: Interventional Cardiology

## 2019-09-16 DIAGNOSIS — I10 Essential (primary) hypertension: Secondary | ICD-10-CM

## 2019-10-15 ENCOUNTER — Other Ambulatory Visit: Payer: Self-pay | Admitting: Interventional Cardiology

## 2019-10-15 DIAGNOSIS — I10 Essential (primary) hypertension: Secondary | ICD-10-CM

## 2019-10-28 ENCOUNTER — Other Ambulatory Visit: Payer: Self-pay | Admitting: Family Medicine

## 2019-10-28 DIAGNOSIS — Z1239 Encounter for other screening for malignant neoplasm of breast: Secondary | ICD-10-CM

## 2019-11-06 DIAGNOSIS — Z23 Encounter for immunization: Secondary | ICD-10-CM | POA: Diagnosis not present

## 2019-11-15 ENCOUNTER — Other Ambulatory Visit: Payer: Self-pay | Admitting: Interventional Cardiology

## 2019-11-15 DIAGNOSIS — I10 Essential (primary) hypertension: Secondary | ICD-10-CM

## 2019-11-25 ENCOUNTER — Ambulatory Visit: Payer: Medicare HMO

## 2019-12-12 ENCOUNTER — Other Ambulatory Visit: Payer: Self-pay | Admitting: Interventional Cardiology

## 2019-12-12 DIAGNOSIS — I10 Essential (primary) hypertension: Secondary | ICD-10-CM

## 2019-12-27 ENCOUNTER — Other Ambulatory Visit: Payer: Self-pay | Admitting: Interventional Cardiology

## 2019-12-27 DIAGNOSIS — I10 Essential (primary) hypertension: Secondary | ICD-10-CM

## 2020-01-18 IMAGING — CT CT CERVICAL SPINE W/O CM
5 of 8 series · 12 of 33 positions shown, 13 images · non-contrast
Comparison: 10/06/2015.

CLINICAL DATA: Fall several days ago with posterior head injury and
neck pain, initial encounter

EXAM:
CT HEAD WITHOUT CONTRAST
CT CERVICAL SPINE WITHOUT CONTRAST
TECHNIQUE: Multidetector CT imaging of the head and cervical spine was
performed following the standard protocol without intravenous
contrast. Multiplanar CT image reconstructions of the cervical spine
were also generated.

[Series 5: head bone · axial · 0.41mm/px · z∈[-57,-7]mm · 2 of 75 slices shown]
[im 25/75  bone]
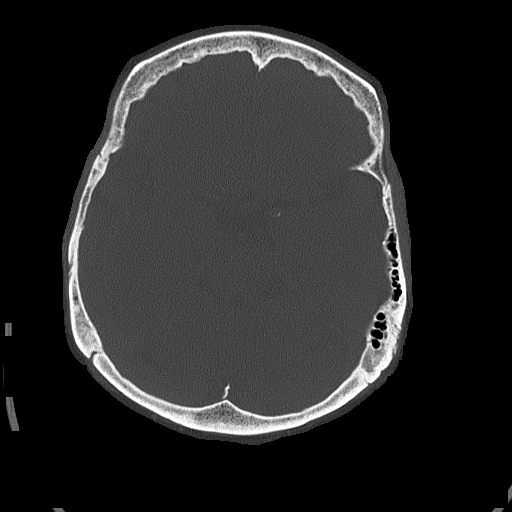
[im 50/75  bone]
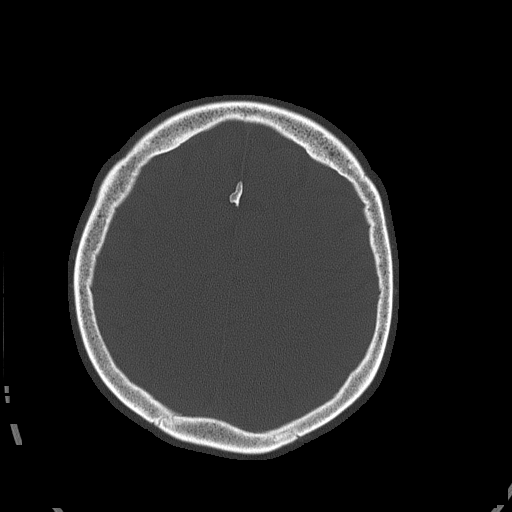

[Series 8: c_spine 2.0 st · axial · 0.28mm/px · z∈[-223,-131]mm · 3 of 93 slices shown, 4 images]
[im 24/93  soft-tissue]
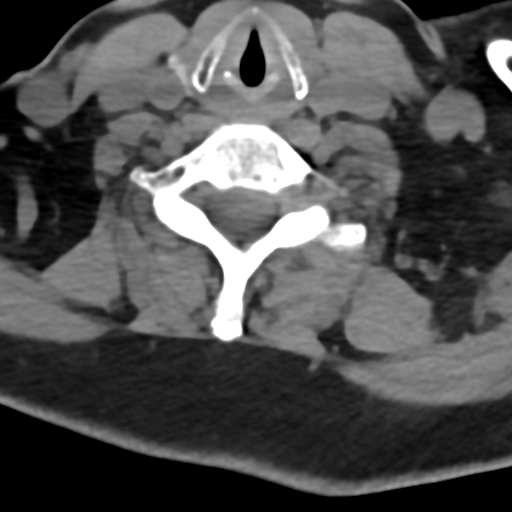
[im 24/93  bone]
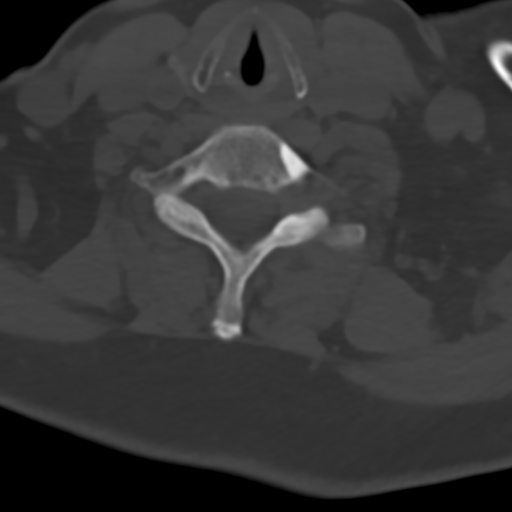
[im 47/93  bone]
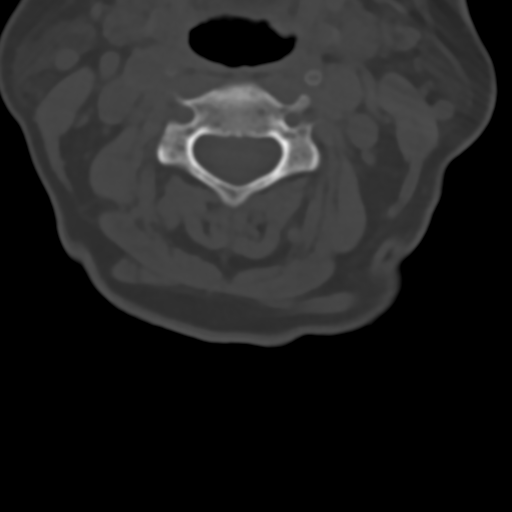
[im 70/93  bone]
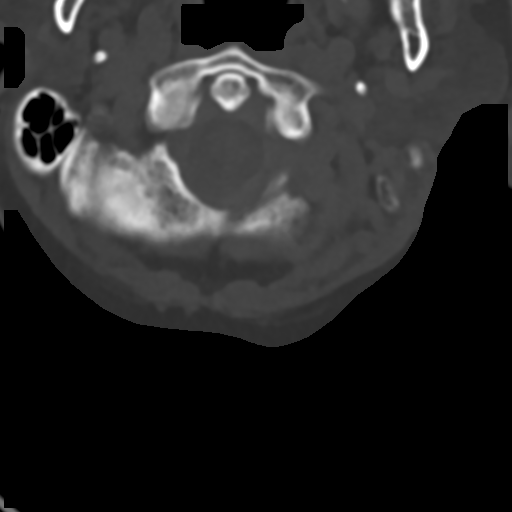

[Series 10: c_spine 2.0 sag bone · sagittal · 0.27mm/px · 4 of 47 slices shown]
[im 10/47  bone]
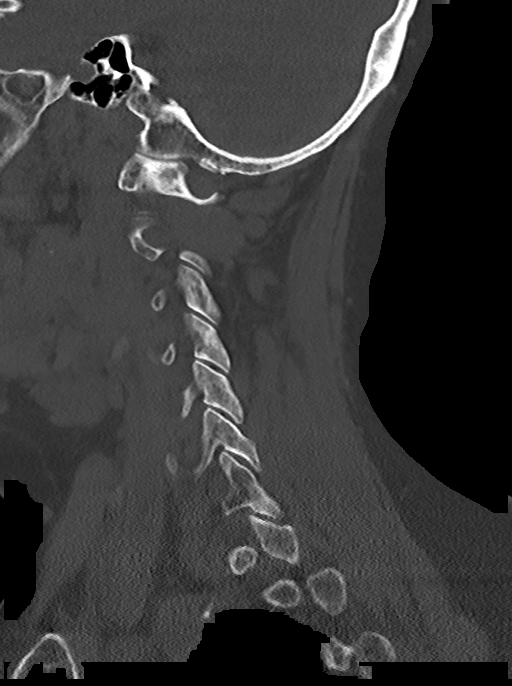
[im 19/47  bone]
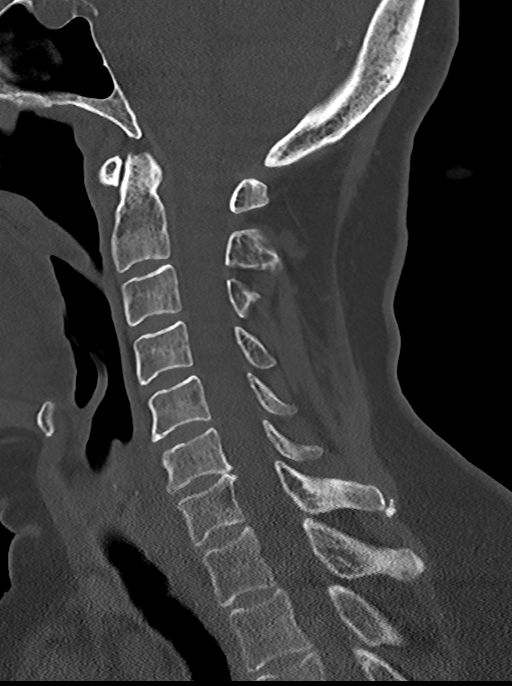
[im 28/47  bone]
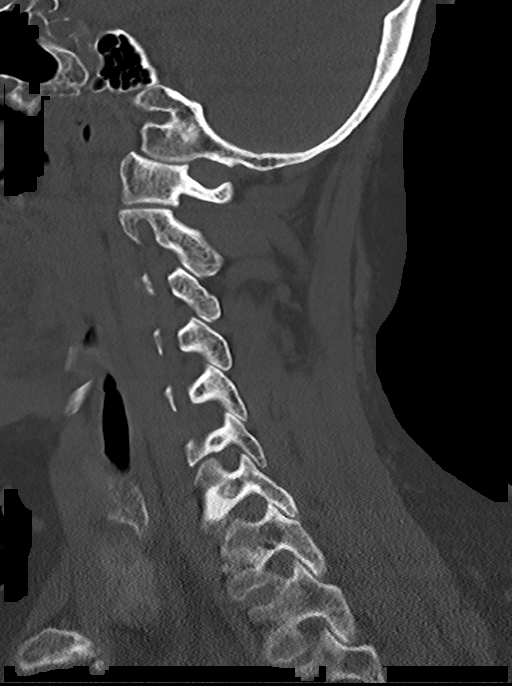
[im 37/47  bone]
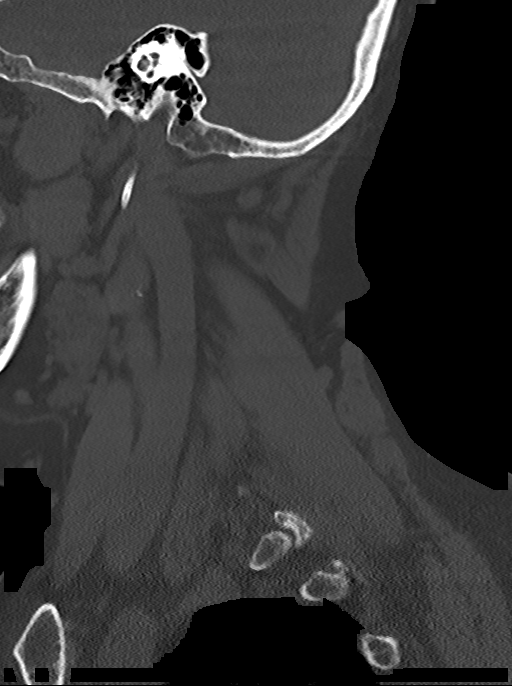

[Series 11: c_spine 2.0 cor bone · coronal · 0.27mm/px · 1 of 61 slices shown]
[im 31/61  bone]
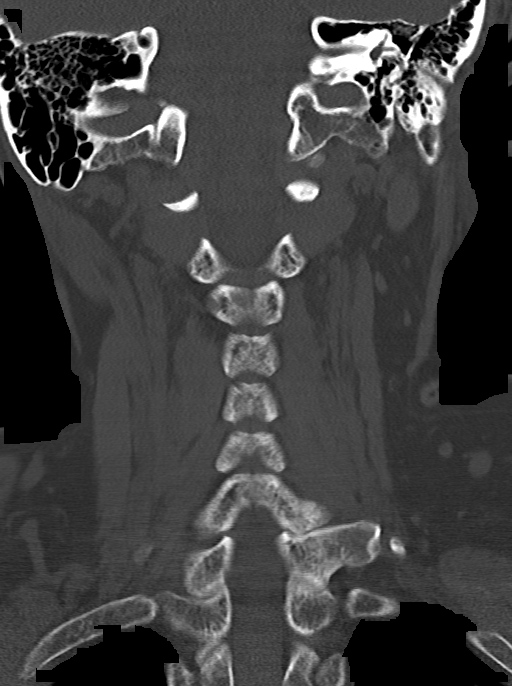

[Series 12: c_spine 2.0 orthogonals · axial · 0.21mm/px · z∈[-230,-179]mm · 2 of 86 slices shown]
[im 29/86  bone]
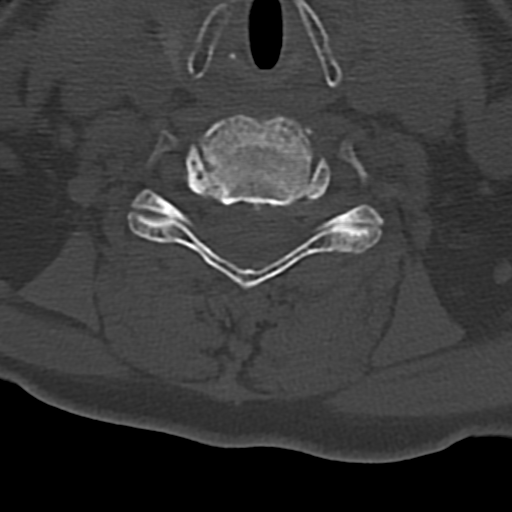
[im 57/86  bone]
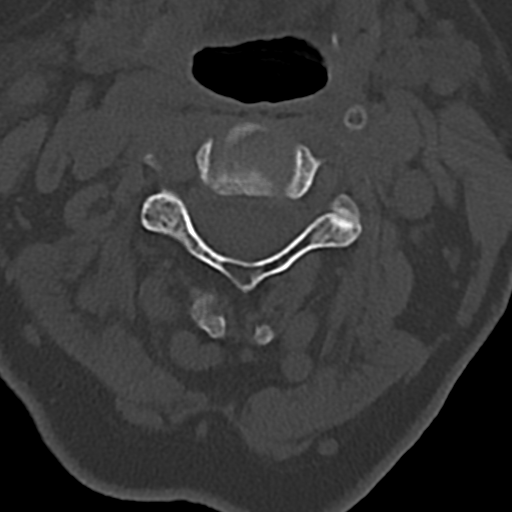

[12 of 33 positions shown; findings below may reference images not displayed]

FINDINGS: CT HEAD FINDINGS

Brain: No evidence of acute infarction, hemorrhage, hydrocephalus,
extra-axial collection or mass lesion/mass effect.

Vascular: No hyperdense vessel or unexpected calcification.

Skull: Normal. Negative for fracture or focal lesion.

Sinuses/Orbits: Mucosal retention cysts are noted within the right
maxillary antrum.

Other: None.

CT CERVICAL SPINE FINDINGS

Alignment: Within normal limits.

Skull base and vertebrae: 7 cervical segments are well visualized.
Vertebral body height is well maintained. Mild osteophytic changes
are noted at C6-7. No anterolisthesis is seen. No acute fracture or
acute facet abnormality is noted.

Soft tissues and spinal canal: No prevertebral fluid or swelling. No
visible canal hematoma.

Upper chest: Negative.

Other: None
IMPRESSION: CT of the head: No acute intracranial abnormality noted.

CT of the cervical spine: Mild degenerative change without acute
abnormality.

## 2020-06-22 NOTE — Progress Notes (Signed)
Cardiology Office Note   Date:  07/04/2020   ID:  [name], DOB 1951/02/12, MRN [id number]  PCP:  Zenia Resides, MD  Cardiologist: Dr. Tamala Julian, MD   Chief Complaint  Patient presents with  . Follow-up    History of Present Illness: [name] is a 69 y.o. female who presents for follow up, seen for Dr. Tamala Julian.   [name] has a hx of HTN and PVCs. She was referred to Dr. Tamala Julian at which time she had a negative CAD workup with low risk myocardial perfusion study 01/2014. Echocardiogram at that time showed moderate LVH.   She was last seen by Dr. Tamala Julian 10/23/2017 for followup and was doing well from a CV standpoint. She wore a monitor 09/2017 which showed SB with an average HR of 48bpm, rare PACs and PVCs he had issues with bradycardia while on BB therefore this was discontinued.   Today [name] presents for follow-up after running out of her medications for several days.  She has been doing well since last seen by Dr. Tamala Julian.  BP is elevated on exam today at 172/80 however she reports she has not had her medication since last Friday.  She has had no anginal symptoms including chest pain, palpitations, orthopnea, PND, shortness of breath, dizziness or syncope.  Has mild edema on exam however states this is likely in the setting of missed Zestoretic.  Past Medical History:  Diagnosis Date  . Hypertension     Past Surgical History:  Procedure Laterality Date  . ABDOMINAL HYSTERECTOMY       Current Outpatient Medications  Medication Sig Dispense Refill  . aspirin 81 MG EC tablet TAKE 1 TABLET (81 MG TOTAL) BY MOUTH DAILY. 90 tablet 3  . KLOR-CON M20 20 MEQ tablet Take 1 tablet (20 mEq total) by mouth daily. 90 tablet 3  . lisinopril-hydrochlorothiazide (ZESTORETIC) 20-12.5 MG tablet Take 2 tablets by mouth daily. 180 tablet 3  . pravastatin (PRAVACHOL) 40 MG tablet Take 1 tablet (40 mg total) by mouth daily. 90 tablet 3  . spironolactone (ALDACTONE) 50  MG tablet Take 1 tablet (50 mg total) by mouth daily. 90 tablet 3   No current facility-administered medications for this visit.    Allergies:   Lipitor [atorvastatin]    Social History:  The patient  reports that she quit smoking about 30 years ago. She quit after 5.00 years of use. She has never used smokeless tobacco. She reports that she does not drink alcohol and does not use drugs.   Family History:  The patient's family history is not on file.    ROS:  Please see the history of present illness. Otherwise, review of systems are positive for none.  All other systems are reviewed and negative.    PHYSICAL EXAM: VS:  BP (!) 172/80   Pulse 73   Ht 5\' 1"  (1.549 m)   Wt 173 lb 9.6 oz (78.7 kg)   SpO2 96%   BMI 32.80 kg/m  , BMI Body mass index is 32.8 kg/m.   General: Well developed, well nourished, NAD Neck: Negative for carotid bruits. No JVD Lungs:Clear to ausculation bilaterally. No wheezes, rales, or rhonchi. Breathing is unlabored. Cardiovascular: RRR with S1 S2. No murmurs Extremities: No edema.  Neuro: Alert and oriented. No focal deficits. No facial asymmetry. MAE spontaneously. Psych: Responds to questions appropriately with normal affect.     EKG:  EKG is ordered today. The ekg ordered today demonstrates NSR  with HR 73bpm and one PVC with no TW abnormalities   Recent Labs: No results found for requested labs within last 8760 hours.    Lipid Panel    Component Value Date/Time   CHOL 221 (H) 07/10/2017 1424   TRIG 74 07/10/2017 1424   HDL 45 07/10/2017 1424   CHOLHDL 4.9 (H) 07/10/2017 1424   CHOLHDL 4.3 01/11/2014 0250   VLDL 11 01/11/2014 0250   LDLCALC 161 (H) 07/10/2017 1424   LDLDIRECT 140 (H) 08/17/2011 0949     Wt Readings from Last 3 Encounters:  07/04/20 173 lb 9.6 oz (78.7 kg)  05/07/18 170 lb 6.4 oz (77.3 kg)  10/23/17 170 lb 12.8 oz (77.5 kg)    Other studies Reviewed: Additional studies/ records that were reviewed today include:   Review of the above records demonstrates:  Holter Monitor 09/25/2017 Study Highlights  Addendum by Belva Crome, MD on Wed Oct 16, 2017 3:08 PM    Sinus bradycardia with average HR 48 bpm and range 35-78 bpm  Rare PAC's and PVC's  Sinus bradycardia, without HR > 80 bpm. Consider chronotropic incompetence. Occasional PVC's and PAC's No diary entries   ECHOCARDIOGRAM 09/2017: Study Conclusions  - Left ventricle: The cavity size was normal. Wall thickness was normal. Systolic function was normal. The estimated ejection fraction was in the range of 55% to 60%. Wall motion was normal; there were no regional wall motion abnormalities. Doppler parameters are consistent with abnormal left ventricular relaxation (grade 1 diastolic dysfunction). - Mitral valve: Calcified annulus. Valve area by pressure half-time: 1.76 cm^2. - Left atrium: The atrium was mildly dilated. Volume/bsa, ES, (1-plane Simpson&'s, A2C): 41 ml/m^2.   ASSESSMENT AND PLAN:  1. HTN: -Elevated at 172/80 today however patient reports that she ran out of her medication last Friday and has not had any since this time. -We will send refills for Zestoretic 20/12.5, spironolactone to her pharmacy -Obtain lab work today  2. PVCs: -Reports symptoms have improved, no specific complaints of palpitations on exam today  -Not currently on beta-blocker therapy given history of bradycardia    Current medicines are reviewed at length with the patient today.  The patient does not have concerns regarding medicines.  The following changes have been made:  no change  Labs/ tests ordered today include: BMET  Orders Placed This Encounter  Procedures  . Basic metabolic panel  . EKG 12-Lead    Disposition:   FU with Dr. Tamala Julian in 1 year  Signed, Kathyrn Drown, NP  07/04/2020 4:20 PM    [address] [place], [place],   [address] Phone: [phone]; Fax: [phone]

## 2020-07-04 ENCOUNTER — Other Ambulatory Visit: Payer: Self-pay

## 2020-07-04 ENCOUNTER — Encounter: Payer: Self-pay | Admitting: Cardiology

## 2020-07-04 ENCOUNTER — Ambulatory Visit (INDEPENDENT_AMBULATORY_CARE_PROVIDER_SITE_OTHER): Payer: Medicare HMO | Admitting: Cardiology

## 2020-07-04 DIAGNOSIS — E7800 Pure hypercholesterolemia, unspecified: Secondary | ICD-10-CM

## 2020-07-04 DIAGNOSIS — I10 Essential (primary) hypertension: Secondary | ICD-10-CM

## 2020-07-04 MED ORDER — PRAVASTATIN SODIUM 40 MG PO TABS: 40.0000 mg | ORAL_TABLET | Freq: Every day | ORAL | 3 refills | Status: DC

## 2020-07-04 MED ORDER — SPIRONOLACTONE 50 MG PO TABS: 50.0000 mg | ORAL_TABLET | Freq: Every day | ORAL | 3 refills | Status: DC

## 2020-07-04 MED ORDER — LISINOPRIL-HYDROCHLOROTHIAZIDE 20-12.5 MG PO TABS: 2.0000 | ORAL_TABLET | Freq: Every day | ORAL | 3 refills | Status: DC

## 2020-07-04 MED ORDER — KLOR-CON M20 20 MEQ PO TBCR: 20.0000 meq | EXTENDED_RELEASE_TABLET | Freq: Every day | ORAL | 3 refills | Status: DC

## 2020-07-04 NOTE — Patient Instructions (Signed)
Your physician recommends that you continue on your current medications as directed. Please refer to the Current Medication list given to you today.  Refills have been sent in to your pharmacy for your Cardiac medications.  *If you need a refill on your cardiac medications before your next appointment, please call your pharmacy*   Lab Work: TODAY: BMET If you have labs (blood work) drawn today and your tests are completely normal, you will receive your results only by: Marland Kitchen MyChart Message (if you have MyChart) OR . A paper copy in the mail If you have any lab test that is abnormal or we need to change your treatment, we will call you to review the results.   Testing/Procedures: NONE   Follow-Up: At [facility], you and your health needs are our priority.  As part of our continuing mission to provide you with exceptional heart care, we have created designated Provider Care Teams.  These Care Teams include your primary Cardiologist (physician) and Advanced Practice Providers (APPs -  Physician Assistants and Nurse Practitioners) who all work together to provide you with the care you need, when you need it.  We recommend signing up for the patient portal called "MyChart".  Sign up information is provided on this After Visit Summary.  MyChart is used to connect with patients for Virtual Visits (Telemedicine).  Patients are able to view lab/test results, encounter notes, upcoming appointments, etc.  Non-urgent messages can be sent to your provider as well.   To learn more about what you can do with MyChart, go to [email or web address].    Your next appointment:   1 year(s)  The format for your next appointment:   In Person  Provider:   You may see DR. SMITH or one of the following Engineer, manufacturing Providers on your designated Care Team:    Truitt Merle, NP  Cecilie Kicks, NP  Kathyrn Drown, NP

## 2020-07-05 LAB — BASIC METABOLIC PANEL
BUN/Creatinine Ratio: 27 (ref 12–28)
BUN: 28 mg/dL — ABNORMAL HIGH (ref 8–27)
CO2: 22 mmol/L (ref 20–29)
Calcium: 9.7 mg/dL (ref 8.7–10.3)
Chloride: 107 mmol/L — ABNORMAL HIGH (ref 96–106)
Creatinine, Ser: 1.02 mg/dL — ABNORMAL HIGH (ref 0.57–1.00)
GFR calc Af Amer: 65 mL/min/{1.73_m2} (ref >59)
GFR calc non Af Amer: 57 mL/min/{1.73_m2} — ABNORMAL LOW (ref >59)
Glucose: 88 mg/dL (ref 65–99)
Potassium: 3.7 mmol/L (ref 3.5–5.2)
Sodium: 144 mmol/L (ref 134–144)

## 2021-04-27 DIAGNOSIS — Z23 Encounter for immunization: Secondary | ICD-10-CM | POA: Diagnosis not present

## 2021-05-03 ENCOUNTER — Other Ambulatory Visit: Payer: Self-pay | Admitting: Family Medicine

## 2021-05-03 DIAGNOSIS — Z1231 Encounter for screening mammogram for malignant neoplasm of breast: Secondary | ICD-10-CM

## 2021-05-17 ENCOUNTER — Other Ambulatory Visit: Payer: Self-pay

## 2021-05-17 ENCOUNTER — Encounter: Payer: Self-pay | Admitting: Family Medicine

## 2021-05-17 ENCOUNTER — Ambulatory Visit (INDEPENDENT_AMBULATORY_CARE_PROVIDER_SITE_OTHER): Payer: Medicare HMO | Admitting: Family Medicine

## 2021-05-17 DIAGNOSIS — D649 Anemia, unspecified: Secondary | ICD-10-CM | POA: Diagnosis not present

## 2021-05-17 DIAGNOSIS — Z0000 Encounter for general adult medical examination without abnormal findings: Secondary | ICD-10-CM | POA: Diagnosis not present

## 2021-05-17 DIAGNOSIS — N179 Acute kidney failure, unspecified: Secondary | ICD-10-CM | POA: Diagnosis not present

## 2021-05-17 DIAGNOSIS — E7800 Pure hypercholesterolemia, unspecified: Secondary | ICD-10-CM | POA: Diagnosis not present

## 2021-05-17 DIAGNOSIS — Z86010 Personal history of colonic polyps: Secondary | ICD-10-CM

## 2021-05-17 DIAGNOSIS — I10 Essential (primary) hypertension: Secondary | ICD-10-CM | POA: Diagnosis not present

## 2021-05-17 NOTE — Patient Instructions (Addendum)
Call [facility] to schedule your colonoscopy.  By my records you are due I will call with blood test results. Great job on the weight loss.  It will help Keep checking your blood pressure at home.  Make sure it stays 140/90 or less.   You are due for another pneumonia vaccine.  I will give that next visit.  I suggest you do not take the second shingles vaccine.

## 2021-05-18 ENCOUNTER — Encounter: Payer: Self-pay | Admitting: Family Medicine

## 2021-05-18 DIAGNOSIS — N179 Acute kidney failure, unspecified: Secondary | ICD-10-CM | POA: Insufficient documentation

## 2021-05-18 DIAGNOSIS — D649 Anemia, unspecified: Secondary | ICD-10-CM | POA: Insufficient documentation

## 2021-05-18 DIAGNOSIS — Z0000 Encounter for general adult medical examination without abnormal findings: Secondary | ICD-10-CM | POA: Insufficient documentation

## 2021-05-18 LAB — CBC
Hematocrit: 30.8 % — ABNORMAL LOW (ref 34.0–46.6)
Hemoglobin: 10.2 g/dL — ABNORMAL LOW (ref 11.1–15.9)
MCH: 29.7 pg (ref 26.6–33.0)
MCHC: 33.1 g/dL (ref 31.5–35.7)
MCV: 90 fL (ref 79–97)
Platelets: 252 10*3/uL (ref 150–450)
RBC: 3.43 x10E6/uL — ABNORMAL LOW (ref 3.77–5.28)
RDW: 14.2 % (ref 11.7–15.4)
WBC: 5 10*3/uL (ref 3.4–10.8)

## 2021-05-18 LAB — CMP14+EGFR
ALT: 15 IU/L (ref 0–32)
AST: 19 IU/L (ref 0–40)
Albumin/Globulin Ratio: 1.7 (ref 1.2–2.2)
Albumin: 4.3 g/dL (ref 3.8–4.8)
Alkaline Phosphatase: 67 IU/L (ref 44–121)
BUN/Creatinine Ratio: 29 — ABNORMAL HIGH (ref 12–28)
BUN: 71 mg/dL — ABNORMAL HIGH (ref 8–27)
Bilirubin Total: 0.7 mg/dL (ref 0.0–1.2)
CO2: 17 mmol/L — ABNORMAL LOW (ref 20–29)
Calcium: 9.3 mg/dL (ref 8.7–10.3)
Chloride: 105 mmol/L (ref 96–106)
Creatinine, Ser: 2.41 mg/dL — ABNORMAL HIGH (ref 0.57–1.00)
Globulin, Total: 2.5 g/dL (ref 1.5–4.5)
Glucose: 122 mg/dL — ABNORMAL HIGH (ref 65–99)
Potassium: 5 mmol/L (ref 3.5–5.2)
Sodium: 139 mmol/L (ref 134–144)
Total Protein: 6.8 g/dL (ref 6.0–8.5)
eGFR: 21 mL/min/{1.73_m2} — ABNORMAL LOW (ref >59)

## 2021-05-18 LAB — LIPID PANEL
Chol/HDL Ratio: 8 ratio — ABNORMAL HIGH (ref 0.0–4.4)
Cholesterol, Total: 168 mg/dL (ref 100–199)
HDL: 21 mg/dL — ABNORMAL LOW (ref >39)
LDL Chol Calc (NIH): 112 mg/dL — ABNORMAL HIGH (ref 0–99)
Triglycerides: 196 mg/dL — ABNORMAL HIGH (ref 0–149)
VLDL Cholesterol Cal: 35 mg/dL (ref 5–40)

## 2021-05-18 LAB — TSH: TSH: 1.12 u[IU]/mL (ref 0.450–4.500)

## 2021-05-18 NOTE — Assessment & Plan Note (Signed)
Return to GI for colonoscopy.

## 2021-05-18 NOTE — Assessment & Plan Note (Signed)
I am surprised by her lab results showing marked increase in creat.  Called patient.  Told to stop both antihypertensives and FU in one week.  I am unclear if this is all AKI versus CKD.  She needs a work up.

## 2021-05-18 NOTE — Assessment & Plan Note (Signed)
Lab recheck done.  LDL a bit high.  No change in meds until kidney disease better defined.

## 2021-05-18 NOTE — Assessment & Plan Note (Signed)
See AKI.  Currently holding both spironolactone and lisinopril/HCTZ.  Home BP.  Bring in readings next week.

## 2021-05-18 NOTE — Assessment & Plan Note (Signed)
>>  ASSESSMENT AND PLAN FOR HYPERCHOLESTEROLEMIA WRITTEN ON 05/18/2021  2:09 PM BY HENSEL, Santiago Bumpers, MD  Lab recheck done.  LDL a bit high.  No change in meds until kidney disease better defined.

## 2021-05-18 NOTE — Assessment & Plan Note (Signed)
Mild.  Likely due to renal disease.  Unclear if CKD versus AKI or both.

## 2021-05-18 NOTE — Progress Notes (Signed)
    SUBJECTIVE:   CHIEF COMPLAINT / HPI:   Annual exam: Acute problem, feels tired, needs pep.  Has not been the same since taking her first shingrex vaccine, I believe in January.  Was out of work x 1 week.  No focal sx.  Denies cough, CP, DOE.  Has had weight loss which she describes as voluntary although she is not certain how she has lost the weight.   Chronic problems Hypertension:  "It is always up when I come to the office.  I check it at home and it is fine."   High cholesterol.  Primary prevention.  On statin and daily ASA. HPDP:  Has had COVID vaccine x 3.  Due for second booster.  Put this off because of recent bad vaccine reaction.  Also due for pneumovax. Colonoscopy Due.  Previous polyps.  She is aware.  PERTINENT  PMH / PSH: No bleeding, skin changes.  No change in bowel, bladder or appetite.  Wt loss as above.   OBJECTIVE:   BP (!) 154/82   Pulse 74   Ht 5\' 1"  (1.549 m)   Wt 154 lb 3.2 oz (69.9 kg)   SpO2 98%   BMI 29.14 kg/m   HEENT WNL Neck supple without masses Lungs clear Cardiac RRR without m or g Abd benign. Ext No edema. Neuro motor, sensory, gait, affect and cognition all grossly normal.  ASSESSMENT/PLAN:   Preventative health care Health conscious woman with no at risk behaviors.    History of adenomatous polyp of colon Return to GI for colonoscopy.  Acute kidney injury ([place]) I am surprised by her lab results showing marked increase in creat.  Called patient.  Told to stop both antihypertensives and FU in one week.  I am unclear if this is all AKI versus CKD.  She needs a work up.  HYPERTENSION, BENIGN SYSTEMIC See AKI.  Currently holding both spironolactone and lisinopril/HCTZ.  Home BP.  Bring in readings next week.  Anemia Mild.  Likely due to renal disease.  Unclear if CKD versus AKI or both.    HYPERCHOLESTEROLEMIA Lab recheck done.  LDL a bit high.  No change in meds until kidney disease better defined.       Laurie Resides,  MD [place]

## 2021-05-18 NOTE — Assessment & Plan Note (Signed)
Health conscious woman with no at risk behaviors.

## 2021-05-25 ENCOUNTER — Other Ambulatory Visit: Payer: Self-pay

## 2021-05-25 ENCOUNTER — Encounter: Payer: Self-pay | Admitting: Family Medicine

## 2021-05-25 ENCOUNTER — Ambulatory Visit (INDEPENDENT_AMBULATORY_CARE_PROVIDER_SITE_OTHER): Payer: Medicare HMO | Admitting: Family Medicine

## 2021-05-25 DIAGNOSIS — N179 Acute kidney failure, unspecified: Secondary | ICD-10-CM | POA: Diagnosis not present

## 2021-05-25 DIAGNOSIS — R739 Hyperglycemia, unspecified: Secondary | ICD-10-CM

## 2021-05-25 DIAGNOSIS — Z86010 Personal history of colonic polyps: Secondary | ICD-10-CM | POA: Diagnosis not present

## 2021-05-25 DIAGNOSIS — R634 Abnormal weight loss: Secondary | ICD-10-CM | POA: Insufficient documentation

## 2021-05-25 DIAGNOSIS — I10 Essential (primary) hypertension: Secondary | ICD-10-CM

## 2021-05-25 DIAGNOSIS — D649 Anemia, unspecified: Secondary | ICD-10-CM | POA: Diagnosis not present

## 2021-05-25 HISTORY — DX: Abnormal weight loss: R63.4

## 2021-05-25 LAB — POCT HEMOGLOBIN: Hemoglobin: 9.6 g/dL — AB (ref 11–14.6)

## 2021-05-25 MED ORDER — AMLODIPINE BESYLATE 5 MG PO TABS: 5.0000 mg | ORAL_TABLET | Freq: Every day | ORAL | 3 refills | Status: DC

## 2021-05-25 NOTE — Patient Instructions (Signed)
I am delighted that you are feeling better.  I am still worried about your kidneys and the weight loss. Please keep the mammogram appointment. Someone should call about two other appointments. 1. GI referral for colonscopy 2. Ultrasound of your kidneys. I will call as these tests come in.  First call will be tomorrow about the blood work. I also order a new blood pressure medication that will not effect your kidneys.

## 2021-05-26 ENCOUNTER — Encounter: Payer: Self-pay | Admitting: Family Medicine

## 2021-05-26 LAB — CBC WITH DIFFERENTIAL/PLATELET
Basophils Absolute: 0.1 10*3/uL (ref 0.0–0.2)
Basos: 1 %
EOS (ABSOLUTE): 0.1 10*3/uL (ref 0.0–0.4)
Eos: 1 %
Hematocrit: 31 % — ABNORMAL LOW (ref 34.0–46.6)
Hemoglobin: 10.1 g/dL — ABNORMAL LOW (ref 11.1–15.9)
Immature Grans (Abs): 0.1 10*3/uL (ref 0.0–0.1)
Immature Granulocytes: 1 %
Lymphocytes Absolute: 2.1 10*3/uL (ref 0.7–3.1)
Lymphs: 29 %
MCH: 29.7 pg (ref 26.6–33.0)
MCHC: 32.6 g/dL (ref 31.5–35.7)
MCV: 91 fL (ref 79–97)
Monocytes Absolute: 0.7 10*3/uL (ref 0.1–0.9)
Monocytes: 10 %
Neutrophils Absolute: 4.3 10*3/uL (ref 1.4–7.0)
Neutrophils: 58 %
Platelets: 339 10*3/uL (ref 150–450)
RBC: 3.4 x10E6/uL — ABNORMAL LOW (ref 3.77–5.28)
RDW: 14 % (ref 11.7–15.4)
WBC: 7.3 10*3/uL (ref 3.4–10.8)

## 2021-05-26 LAB — BASIC METABOLIC PANEL
BUN/Creatinine Ratio: 23 (ref 12–28)
BUN: 27 mg/dL (ref 8–27)
CO2: 20 mmol/L (ref 20–29)
Calcium: 9.8 mg/dL (ref 8.7–10.3)
Chloride: 101 mmol/L (ref 96–106)
Creatinine, Ser: 1.15 mg/dL — ABNORMAL HIGH (ref 0.57–1.00)
Glucose: 96 mg/dL (ref 65–99)
Potassium: 4.8 mmol/L (ref 3.5–5.2)
Sodium: 136 mmol/L (ref 134–144)
eGFR: 52 mL/min/{1.73_m2} — ABNORMAL LOW (ref >59)

## 2021-05-26 LAB — SEDIMENTATION RATE: Sed Rate: 16 mm/hr (ref 0–40)

## 2021-05-26 LAB — FERRITIN: Ferritin: 282 ng/mL — ABNORMAL HIGH (ref 15–150)

## 2021-05-26 NOTE — Assessment & Plan Note (Signed)
Get colonoscopy and observe.  She feels well so I am not ready to blindly pan image.  Recheck in one month.

## 2021-05-26 NOTE — Assessment & Plan Note (Signed)
Add amlodipine.  REcheck in one month.

## 2021-05-26 NOTE — Assessment & Plan Note (Signed)
Resolving on repeat.  Stay off ACE.  Check renal ultrasound.

## 2021-05-26 NOTE — Progress Notes (Signed)
    SUBJECTIVE:   CHIEF COMPLAINT / HPI:   FU AKI and wt loss. See office note of 05/17/21.  We were both surprised at a creat jump from ~1.0 to 2.4.  Stopped ACE and encouraged fluids.  At that visit, also found to have wt loss even though she had not been trying.  She actually feels great.  Issues 1. Presumed AKI.  ACE stopped.  Feels better.  No trouble voiding.  No fever or chills.  No hx of kidney stones. 2. Involuntary wt loss.   She is down 20+ lbs in the last year.   No focal sx.  Up to date on mammogram.  Needs colonoscopy.  Previous labs reassuring except creat. 3. HBP Readings high off ACE.  No CP or SOB    OBJECTIVE:   BP (!) 158/84   Pulse (!) 59   Ht 5\' 1"  (1.549 m)   Wt 151 lb 12.8 oz (68.9 kg)   SpO2 97%   BMI 28.68 kg/m   Lungs clear Cardiac RRR without m or g  ASSESSMENT/PLAN:   Acute kidney injury ([place]) Resolving on repeat.  Stay off ACE.  Check renal ultrasound.  Weight loss, unintentional Get colonoscopy and observe.  She feels well so I am not ready to blindly pan image.  Recheck in one month.  HYPERTENSION, BENIGN SYSTEMIC Add amlodipine.  REcheck in one month.     Zenia Resides, MD [place]

## 2021-06-05 ENCOUNTER — Ambulatory Visit
Admission: RE | Admit: 2021-06-05 | Discharge: 2021-06-05 | Disposition: A | Discharge status: Home / Self Care | Payer: Medicare HMO | Source: Ambulatory Visit | Attending: Family Medicine | Admitting: Family Medicine

## 2021-06-05 DIAGNOSIS — R634 Abnormal weight loss: Secondary | ICD-10-CM | POA: Diagnosis not present

## 2021-06-05 DIAGNOSIS — N179 Acute kidney failure, unspecified: Secondary | ICD-10-CM

## 2021-06-05 DIAGNOSIS — N281 Cyst of kidney, acquired: Secondary | ICD-10-CM | POA: Diagnosis not present

## 2021-06-15 ENCOUNTER — Encounter: Payer: Self-pay | Admitting: Family Medicine

## 2021-06-15 ENCOUNTER — Other Ambulatory Visit: Payer: Self-pay | Admitting: Family Medicine

## 2021-06-15 ENCOUNTER — Emergency Department (HOSPITAL_COMMUNITY)
Admission: EM | Admit: 2021-06-15 | Discharge: 2021-06-15 | Disposition: A | Discharge status: Home / Self Care | Payer: Medicare HMO | Attending: Emergency Medicine | Admitting: Emergency Medicine

## 2021-06-15 ENCOUNTER — Other Ambulatory Visit: Payer: Self-pay

## 2021-06-15 DIAGNOSIS — R220 Localized swelling, mass and lump, head: Secondary | ICD-10-CM

## 2021-06-15 DIAGNOSIS — K148 Other diseases of tongue: Secondary | ICD-10-CM | POA: Insufficient documentation

## 2021-06-15 DIAGNOSIS — Z79899 Other long term (current) drug therapy: Secondary | ICD-10-CM | POA: Diagnosis not present

## 2021-06-15 DIAGNOSIS — I10 Essential (primary) hypertension: Secondary | ICD-10-CM | POA: Insufficient documentation

## 2021-06-15 DIAGNOSIS — R609 Edema, unspecified: Secondary | ICD-10-CM | POA: Diagnosis not present

## 2021-06-15 DIAGNOSIS — Z87891 Personal history of nicotine dependence: Secondary | ICD-10-CM | POA: Diagnosis not present

## 2021-06-15 DIAGNOSIS — Z7982 Long term (current) use of aspirin: Secondary | ICD-10-CM | POA: Diagnosis not present

## 2021-06-15 LAB — BASIC METABOLIC PANEL
Anion gap: 9 (ref 5–15)
BUN: 12 mg/dL (ref 8–23)
CO2: 25 mmol/L (ref 22–32)
Calcium: 9.5 mg/dL (ref 8.9–10.3)
Chloride: 106 mmol/L (ref 98–111)
Creatinine, Ser: 1.07 mg/dL — ABNORMAL HIGH (ref 0.44–1.00)
GFR, Estimated: 56 mL/min — ABNORMAL LOW (ref >60)
Glucose, Bld: 130 mg/dL — ABNORMAL HIGH (ref 70–99)
Potassium: 3.4 mmol/L — ABNORMAL LOW (ref 3.5–5.1)
Sodium: 140 mmol/L (ref 135–145)

## 2021-06-15 LAB — CBC WITH DIFFERENTIAL/PLATELET
Abs Immature Granulocytes: 0.06 10*3/uL (ref 0.00–0.07)
Basophils Absolute: 0 10*3/uL (ref 0.0–0.1)
Basophils Relative: 1 %
Eosinophils Absolute: 0.1 10*3/uL (ref 0.0–0.5)
Eosinophils Relative: 1 %
HCT: 39.4 % (ref 36.0–46.0)
Hemoglobin: 13.1 g/dL (ref 12.0–15.0)
Immature Granulocytes: 1 %
Lymphocytes Relative: 19 %
Lymphs Abs: 1.4 10*3/uL (ref 0.7–4.0)
MCH: 30.2 pg (ref 26.0–34.0)
MCHC: 33.2 g/dL (ref 30.0–36.0)
MCV: 90.8 fL (ref 80.0–100.0)
Monocytes Absolute: 0.5 10*3/uL (ref 0.1–1.0)
Monocytes Relative: 8 %
Neutro Abs: 5.1 10*3/uL (ref 1.7–7.7)
Neutrophils Relative %: 70 %
Platelets: 299 10*3/uL (ref 150–400)
RBC: 4.34 MIL/uL (ref 3.87–5.11)
RDW: 14.3 % (ref 11.5–15.5)
WBC: 7.2 10*3/uL (ref 4.0–10.5)
nRBC: 0 % (ref 0.0–0.2)

## 2021-06-15 MED ORDER — FAMOTIDINE IN NACL 20-0.9 MG/50ML-% IV SOLN
20.0000 mg | INTRAVENOUS | Status: AC
  Administered 2021-06-15: 20 mg via INTRAVENOUS
  Filled 2021-06-15: qty 50

## 2021-06-15 MED ORDER — EPINEPHRINE 0.3 MG/0.3ML IJ SOAJ
0.3000 mg | Freq: Once | INTRAMUSCULAR | Status: AC
  Administered 2021-06-15: 0.3 mg via INTRAMUSCULAR
  Filled 2021-06-15: qty 0.3

## 2021-06-15 MED ORDER — EPINEPHRINE 0.3 MG/0.3ML IJ SOAJ: 0.3000 mg | INTRAMUSCULAR | 0 refills | Status: AC | PRN

## 2021-06-15 MED ORDER — METHYLPREDNISOLONE SODIUM SUCC 125 MG IJ SOLR
125.0000 mg | Freq: Once | INTRAMUSCULAR | Status: AC
  Administered 2021-06-15: 125 mg via INTRAVENOUS
  Filled 2021-06-15: qty 2

## 2021-06-15 MED ORDER — ONDANSETRON HCL 4 MG/2ML IJ SOLN
4.0000 mg | Freq: Once | INTRAMUSCULAR | Status: AC
  Administered 2021-06-15: 4 mg via INTRAVENOUS
  Filled 2021-06-15: qty 2

## 2021-06-15 MED ORDER — DIPHENHYDRAMINE HCL 50 MG/ML IJ SOLN
25.0000 mg | Freq: Once | INTRAMUSCULAR | Status: AC
  Administered 2021-06-15: 25 mg via INTRAVENOUS
  Filled 2021-06-15: qty 1

## 2021-06-15 NOTE — ED Provider Notes (Signed)
[facility] EMERGENCY DEPARTMENT Provider Note   CSN: [id number] Arrival date & time: 06/15/21  0259     History Chief Complaint  Patient presents with   Oral Swelling    Reaction to medication     [name] is a 70 y.o. female.  The history is provided by the patient and medical records.   70 year old female with history of hypertension, hyperlipidemia, anemia, acid reflux, presenting to the ED with oral swelling.  States 2 weeks ago she was taken off of lisinopril due to worsening renal function and was switched to Norvasc.  States ever since she started taking that she has a "odd" sensation in her mouth.  States last night she did have some minor swelling of her lips and face that would resolve by morning.  She woke up this morning and noticed that her tongue was increasingly more swollen and she is having difficulty swallowing.  She denies any pain of her mouth or throat but states it feels "tight".  She denies any shortness of breath at present.  Has not had any fevers.  No vomiting.  No other medication changes.  No new over-the-counter medications or supplements.  No new foods.  She did not have any medications en route but feels like symptoms continue to worsen since she woke up.  Only known allergy is penicillin.    Past Medical History:  Diagnosis Date   Hypertension     Patient Active Problem List   Diagnosis Date Noted   Weight loss, unintentional 05/25/2021   Elevated blood sugar 05/25/2021   Preventative health care 05/18/2021   Acute kidney injury ([place]) 05/18/2021   Anemia 05/18/2021   History of adenomatous polyp of colon 05/07/2018   PVC's (premature ventricular contractions) 07/10/2017   Abnormal EKG 01/15/2014   VENTRICULAR HYPERTROPHY, LEFT 05/30/2009   HYPERCHOLESTEROLEMIA 02/20/2007   HYPERTENSION, BENIGN SYSTEMIC 02/20/2007   HEARTBURN 02/20/2007    Past Surgical History:  Procedure Laterality Date   ABDOMINAL HYSTERECTOMY        OB History   No obstetric history on file.     No family history on file.  Social History   Tobacco Use   Smoking status: Former    Years: 5.00    Pack years: 0.00    Types: Cigarettes    Quit date: 08/16/1989    Years since quitting: 31.8   Smokeless tobacco: Never  Vaping Use   Vaping Use: Never used  Substance Use Topics   Alcohol use: No   Drug use: No    Home Medications Prior to Admission medications   Medication Sig Start Date End Date Taking? Authorizing Provider  amLODipine (NORVASC) 5 MG tablet Take 1 tablet (5 mg total) by mouth at bedtime. 05/25/21   Zenia Resides, MD  aspirin 81 MG EC tablet TAKE 1 TABLET (81 MG TOTAL) BY MOUTH DAILY.    Zenia Resides, MD  pravastatin (PRAVACHOL) 40 MG tablet Take 1 tablet (40 mg total) by mouth daily. 07/04/20   Tommie Raymond, NP    Allergies    Ace inhibitors and Lipitor [atorvastatin]  Review of Systems   Review of Systems  HENT:         Tongue swelling  All other systems reviewed and are negative.  Physical Exam Updated Vital Signs SpO2 98%   Physical Exam Vitals and nursing note reviewed.  Constitutional:      Appearance: She is well-developed.  HENT:     Head:  Normocephalic and atraumatic.     Comments: No facial swelling noted    Mouth/Throat:     Comments: Tongue is significantly swollen, appears grossly symmetric, is still able to close mouth, phonation is off due to swelling but no stridor, intermittently spitting into emesis bag No dentition Eyes:     General: No scleral icterus.       Right eye: No discharge.     Conjunctiva/sclera: Conjunctivae normal.     Pupils: Pupils are equal, round, and reactive to light.  Cardiovascular:     Rate and Rhythm: Normal rate and regular rhythm.     Heart sounds: Normal heart sounds.  Pulmonary:     Effort: Pulmonary effort is normal.     Breath sounds: Normal breath sounds.  Abdominal:     General: Bowel sounds are normal.     Palpations:  Abdomen is soft.  Musculoskeletal:        General: Normal range of motion.     Cervical back: Normal range of motion.  Skin:    General: Skin is warm and dry.  Neurological:     Mental Status: She is alert and oriented to person, place, and time.    ED Results / Procedures / Treatments   Labs (all labs ordered are listed, but only abnormal results are displayed) Labs Reviewed  BASIC METABOLIC PANEL - Abnormal; Notable for the following components:      Result Value   Potassium 3.4 (*)    Glucose, Bld 130 (*)    Creatinine, Ser 1.07 (*)    GFR, Estimated 56 (*)    All other components within normal limits  CBC WITH DIFFERENTIAL/PLATELET    EKG None  Radiology No results found.  Procedures Procedures   CRITICAL CARE Performed by: Larene Pickett   Total critical care time: 45 minutes  Critical care time was exclusive of separately billable procedures and treating other patients.  Critical care was necessary to treat or prevent imminent or life-threatening deterioration.  Critical care was time spent personally by me on the following activities: development of treatment plan with patient and/or surrogate as well as nursing, discussions with consultants, evaluation of patient's response to treatment, examination of patient, obtaining history from patient or surrogate, ordering and performing treatments and interventions, ordering and review of laboratory studies, ordering and review of radiographic studies, pulse oximetry and re-evaluation of patient's condition.   Medications Ordered in ED Medications  ondansetron (ZOFRAN) injection 4 mg (has no administration in time range)  EPINEPHrine (EPI-PEN) injection 0.3 mg (0.3 mg Intramuscular Given 06/15/21 0319)  diphenhydrAMINE (BENADRYL) injection 25 mg (25 mg Intravenous Given 06/15/21 0329)  methylPREDNISolone sodium succinate (SOLU-MEDROL) 125 mg/2 mL injection 125 mg (125 mg Intravenous Given 06/15/21 0330)  famotidine  (PEPCID) IVPB 20 mg premix (0 mg Intravenous Stopped 06/15/21 0425)    ED Course  I have reviewed the triage vital signs and the nursing notes.  Pertinent labs & imaging results that were available during my care of the patient were reviewed by me and considered in my medical decision making (see chart for details).    MDM Rules/Calculators/A&P                         70 year old female presenting to the ED with tongue swelling.  She was recently taken off lisinopril and switch to amlodipine about 2 weeks ago, has been having some intermittent lip and facial swelling with her nighttime doses since  then.  She awoke this morning with significant swelling of her tongue, does feel she is having some difficulty swallowing and is intermittently spitting her secretions into emesis bag.  She has not had any medications prior to arrival.  She denies any other medication changes, new supplements, foods, etc.  On exam, she does have diffuse swelling of the tongue without lip or facial swelling.  There is no neck swelling, no brawny appearance.  Her phonation is off due to the swelling but no stridor.  She continues intermittently spitting into bag but is able to close her mouth fully when prompted.  VSS.  Suspect this is related to her norvasc as it sounds though she has been having issues with this over the past 2 weeks (has not told her PCP about this however).  Will treat for allergic reaction, reassess.  4:02 AM  Rechecked-- no progression of swelling.  States it actually feels a little better.  She is no longer spitting into emesis bag.  Will continue to monitor.  Awaiting labs.  4:51 AM Swelling continues to improve.  Her renal function is now back to her baseline.  Will monitor for a bit longer and if continued improvement, feel she will stable for discharge home.  6:09 AM Tongue has improved in appearance.  She is tolerating PO without difficulty.  Feel she is stable for discharge.  Will have her  hold her her amlodipine for now as it seems like this may be causative agent as she has had issues since starting this medication.  She will need to follow-up with PCP for alternative management of her HTN.  Script for epi pen has been sent to pharmacy, she is aware of indications for use and need for ER evaluation afterwards if used.    Will call PCP for further directions regarding HTN management.  Return here for any new/acute changes.   Final Clinical Impression(s) / ED Diagnoses Final diagnoses:  Tongue swelling    Rx / DC Orders ED Discharge Orders          Ordered    EPINEPHrine 0.3 mg/0.3 mL IJ SOAJ injection  As needed        06/15/21 0614             Larene Pickett, PA-C 06/15/21 6701    Merrily Pew, MD 06/15/21 [phone]

## 2021-06-15 NOTE — Discharge Instructions (Addendum)
I would hold your amlodipine (norvasc).  Talk with your doctor about management of your blood pressure going forward.  I have sent prescription for epi pen to pharmacy if needed.  Only use this if tongue swells up again (like it did tonight), difficulty swallowing/breathing, etc. If used, you do need to come to the ER for monitoring.

## 2021-06-15 NOTE — Progress Notes (Signed)
DC amlodipine due to ER visit for lip swelling.  Added to allergy list.

## 2021-06-15 NOTE — ED Provider Notes (Signed)
I provided a substantive portion of the care of this patient.  I personally performed the entirety of the history, exam, and medical decision making for this encounter.  Tongue swelling of unclear etiology. Was on ace-I last until last week when she was switched to norvasc 2/2 angioedema. Back tonight with tongue swelling, has apparently gotten better prior to my evaluation. Will continue to observe to ensure improvementy.       Jazz Biddy, Corene Cornea, MD 06/15/21 [phone]

## 2021-06-19 ENCOUNTER — Ambulatory Visit (INDEPENDENT_AMBULATORY_CARE_PROVIDER_SITE_OTHER): Payer: Medicare HMO | Admitting: Family Medicine

## 2021-06-19 ENCOUNTER — Other Ambulatory Visit: Payer: Self-pay

## 2021-06-19 ENCOUNTER — Encounter: Payer: Self-pay | Admitting: Family Medicine

## 2021-06-19 ENCOUNTER — Ambulatory Visit (INDEPENDENT_AMBULATORY_CARE_PROVIDER_SITE_OTHER): Payer: Medicare HMO

## 2021-06-19 DIAGNOSIS — Z23 Encounter for immunization: Secondary | ICD-10-CM

## 2021-06-19 DIAGNOSIS — I10 Essential (primary) hypertension: Secondary | ICD-10-CM

## 2021-06-19 DIAGNOSIS — E7800 Pure hypercholesterolemia, unspecified: Secondary | ICD-10-CM | POA: Diagnosis not present

## 2021-06-19 MED ORDER — SPIRONOLACTONE 25 MG PO TABS: 25.0000 mg | ORAL_TABLET | Freq: Every day | ORAL | 3 refills | Status: DC

## 2021-06-19 NOTE — Assessment & Plan Note (Signed)
Restart pravastatin. Monitor for side effects

## 2021-06-19 NOTE — Assessment & Plan Note (Signed)
Given covid booster #1 (shot #3)

## 2021-06-19 NOTE — Assessment & Plan Note (Signed)
Given low K, will use spironolactone rather than HCTZ as her new BP agent.  Wait for 2-3 weeks after restarting pravastatin to avoid confusion in the unlikely event of another allergic reaction.

## 2021-06-19 NOTE — Assessment & Plan Note (Signed)
>>  ASSESSMENT AND PLAN FOR HYPERCHOLESTEROLEMIA WRITTEN ON 06/19/2021  4:46 PM BY HENSEL, Santiago Bumpers, MD  Restart pravastatin. Monitor for side effects

## 2021-06-19 NOTE — Progress Notes (Signed)
    SUBJECTIVE:   CHIEF COMPLAINT / HPI:   Allergic reaction.  Patient had mouth and tongue swelling which effected her speech.  Went to ER and dxed with a severe allergic reaction.  Told to stop both amolodipine and pravastatin until she saw me.  Most likely culprit is amlodipine in that she has only taken since 05/25/21 visit.  She has been on pravastatin for years without problems.  Feels totally back to normal.  BMP done in ER showed sustained/continued improvement in creat.  Hypertensive.  Mild elevation today.  Not on any meds.  Due for COVID booster.      OBJECTIVE:   BP (!) 156/84   Pulse 94   Ht 5\' 1"  (1.549 m)   Wt 155 lb (70.3 kg)   SpO2 98%   BMI 29.29 kg/m   Mouth normal No cervical adenopathy. Lungs clear Cardiac RRR without m or g  ASSESSMENT/PLAN:   HYPERCHOLESTEROLEMIA Restart pravastatin. Monitor for side effects  HYPERTENSION, BENIGN SYSTEMIC Given low K, will use spironolactone rather than HCTZ as her new BP agent.  Wait for 2-3 weeks after restarting pravastatin to avoid confusion in the unlikely event of another allergic reaction.  COVID-19 vaccine administered Given covid booster #1 (shot #3)     Zenia Resides, MD [place]

## 2021-06-19 NOTE — Patient Instructions (Addendum)
Go back on the pravastatin first for two to three weeks.  Lets see if it is causing any problems Once your feel confident in how the pravastatin is affecting you, start your new blood pressure medicine, spironolactone. Once you start the new blood pressure medicine, start checking your blood pressure at home.  I would need you to call me sooner if it get way high 180/105 or above. The goal is to get you 140/90 or below. See me in 6-8 weeks and bring in your home blood pressure readings.  I will need to do blood at that time.   You will get a Covid booster today.

## 2021-07-03 ENCOUNTER — Ambulatory Visit
Admission: RE | Admit: 2021-07-03 | Discharge: 2021-07-03 | Disposition: A | Discharge status: Home / Self Care | Payer: Medicare HMO | Source: Ambulatory Visit | Attending: Family Medicine | Admitting: Family Medicine

## 2021-07-03 ENCOUNTER — Other Ambulatory Visit: Payer: Self-pay

## 2021-07-03 DIAGNOSIS — Z1231 Encounter for screening mammogram for malignant neoplasm of breast: Secondary | ICD-10-CM | POA: Diagnosis not present

## 2021-09-09 ENCOUNTER — Other Ambulatory Visit: Payer: Self-pay | Admitting: Cardiology

## 2021-09-09 DIAGNOSIS — E7800 Pure hypercholesterolemia, unspecified: Secondary | ICD-10-CM

## 2021-10-05 ENCOUNTER — Other Ambulatory Visit: Payer: Self-pay | Admitting: Cardiology

## 2021-10-05 DIAGNOSIS — E7800 Pure hypercholesterolemia, unspecified: Secondary | ICD-10-CM

## 2021-10-21 ENCOUNTER — Other Ambulatory Visit: Payer: Self-pay | Admitting: Cardiology

## 2021-10-21 DIAGNOSIS — E7800 Pure hypercholesterolemia, unspecified: Secondary | ICD-10-CM

## 2021-11-05 ENCOUNTER — Other Ambulatory Visit: Payer: Self-pay | Admitting: Cardiology

## 2021-11-05 DIAGNOSIS — E7800 Pure hypercholesterolemia, unspecified: Secondary | ICD-10-CM

## 2021-11-13 ENCOUNTER — Other Ambulatory Visit: Payer: Self-pay | Admitting: Cardiovascular Disease

## 2021-11-13 DIAGNOSIS — E7800 Pure hypercholesterolemia, unspecified: Secondary | ICD-10-CM

## 2021-11-15 DIAGNOSIS — Z86010 Personal history of colonic polyps: Secondary | ICD-10-CM | POA: Diagnosis not present

## 2021-11-15 DIAGNOSIS — D124 Benign neoplasm of descending colon: Secondary | ICD-10-CM | POA: Diagnosis not present

## 2021-11-15 DIAGNOSIS — D123 Benign neoplasm of transverse colon: Secondary | ICD-10-CM | POA: Diagnosis not present

## 2021-11-15 DIAGNOSIS — K649 Unspecified hemorrhoids: Secondary | ICD-10-CM | POA: Diagnosis not present

## 2021-11-21 DIAGNOSIS — D124 Benign neoplasm of descending colon: Secondary | ICD-10-CM | POA: Diagnosis not present

## 2021-11-21 DIAGNOSIS — D123 Benign neoplasm of transverse colon: Secondary | ICD-10-CM | POA: Diagnosis not present

## 2021-12-13 ENCOUNTER — Other Ambulatory Visit: Payer: Self-pay | Admitting: Cardiology

## 2021-12-13 DIAGNOSIS — E7800 Pure hypercholesterolemia, unspecified: Secondary | ICD-10-CM

## 2021-12-26 ENCOUNTER — Encounter: Payer: Self-pay | Admitting: Family Medicine

## 2021-12-26 DIAGNOSIS — D126 Benign neoplasm of colon, unspecified: Secondary | ICD-10-CM | POA: Insufficient documentation

## 2022-01-09 ENCOUNTER — Encounter: Payer: Self-pay | Admitting: Family Medicine

## 2022-04-29 ENCOUNTER — Other Ambulatory Visit: Payer: Self-pay | Admitting: Family Medicine

## 2022-04-29 DIAGNOSIS — I10 Essential (primary) hypertension: Secondary | ICD-10-CM

## 2022-05-22 ENCOUNTER — Other Ambulatory Visit: Payer: Self-pay | Admitting: Interventional Cardiology

## 2022-05-22 DIAGNOSIS — E7800 Pure hypercholesterolemia, unspecified: Secondary | ICD-10-CM

## 2022-06-23 ENCOUNTER — Other Ambulatory Visit: Payer: Self-pay | Admitting: Interventional Cardiology

## 2022-06-23 DIAGNOSIS — E7800 Pure hypercholesterolemia, unspecified: Secondary | ICD-10-CM

## 2022-07-05 ENCOUNTER — Other Ambulatory Visit: Payer: Self-pay | Admitting: Interventional Cardiology

## 2022-07-05 DIAGNOSIS — E7800 Pure hypercholesterolemia, unspecified: Secondary | ICD-10-CM

## 2022-07-22 ENCOUNTER — Emergency Department (HOSPITAL_COMMUNITY)
Admission: EM | Admit: 2022-07-22 | Discharge: 2022-07-22 | Disposition: A | Discharge status: Home / Self Care | Payer: Medicare HMO | Attending: Emergency Medicine | Admitting: Emergency Medicine

## 2022-07-22 ENCOUNTER — Other Ambulatory Visit: Payer: Self-pay

## 2022-07-22 ENCOUNTER — Emergency Department (HOSPITAL_COMMUNITY): Payer: Medicare HMO

## 2022-07-22 ENCOUNTER — Encounter (HOSPITAL_COMMUNITY): Payer: Self-pay

## 2022-07-22 DIAGNOSIS — Z79899 Other long term (current) drug therapy: Secondary | ICD-10-CM | POA: Diagnosis not present

## 2022-07-22 DIAGNOSIS — Z90710 Acquired absence of both cervix and uterus: Secondary | ICD-10-CM | POA: Diagnosis not present

## 2022-07-22 DIAGNOSIS — I700 Atherosclerosis of aorta: Secondary | ICD-10-CM | POA: Diagnosis not present

## 2022-07-22 DIAGNOSIS — N3001 Acute cystitis with hematuria: Secondary | ICD-10-CM | POA: Diagnosis not present

## 2022-07-22 DIAGNOSIS — N201 Calculus of ureter: Secondary | ICD-10-CM | POA: Diagnosis not present

## 2022-07-22 DIAGNOSIS — N132 Hydronephrosis with renal and ureteral calculous obstruction: Secondary | ICD-10-CM

## 2022-07-22 DIAGNOSIS — N1330 Unspecified hydronephrosis: Secondary | ICD-10-CM | POA: Insufficient documentation

## 2022-07-22 DIAGNOSIS — K449 Diaphragmatic hernia without obstruction or gangrene: Secondary | ICD-10-CM | POA: Diagnosis not present

## 2022-07-22 LAB — CBC WITH DIFFERENTIAL/PLATELET
Abs Immature Granulocytes: 0.04 10*3/uL (ref 0.00–0.07)
Basophils Absolute: 0 10*3/uL (ref 0.0–0.1)
Basophils Relative: 1 %
Eosinophils Absolute: 0 10*3/uL (ref 0.0–0.5)
Eosinophils Relative: 0 %
HCT: 38 % (ref 36.0–46.0)
Hemoglobin: 13 g/dL (ref 12.0–15.0)
Immature Granulocytes: 1 %
Lymphocytes Relative: 30 %
Lymphs Abs: 2.3 10*3/uL (ref 0.7–4.0)
MCH: 30.3 pg (ref 26.0–34.0)
MCHC: 34.2 g/dL (ref 30.0–36.0)
MCV: 88.6 fL (ref 80.0–100.0)
Monocytes Absolute: 0.7 10*3/uL (ref 0.1–1.0)
Monocytes Relative: 9 %
Neutro Abs: 4.5 10*3/uL (ref 1.7–7.7)
Neutrophils Relative %: 59 %
Platelets: 223 10*3/uL (ref 150–400)
RBC: 4.29 MIL/uL (ref 3.87–5.11)
RDW: 13.8 % (ref 11.5–15.5)
WBC: 7.7 10*3/uL (ref 4.0–10.5)
nRBC: 0 % (ref 0.0–0.2)

## 2022-07-22 LAB — URINALYSIS, MICROSCOPIC (REFLEX)
RBC / HPF: >50 RBC/hpf (ref 0–5)
WBC, UA: >50 WBC/hpf (ref 0–5)

## 2022-07-22 LAB — URINALYSIS, ROUTINE W REFLEX MICROSCOPIC
Bilirubin Urine: NEGATIVE
Glucose, UA: NEGATIVE mg/dL
Ketones, ur: NEGATIVE mg/dL
Nitrite: NEGATIVE
Protein, ur: 100 mg/dL — AB
Specific Gravity, Urine: 1.02 (ref 1.005–1.030)
pH: 5.5 (ref 5.0–8.0)

## 2022-07-22 LAB — COMPREHENSIVE METABOLIC PANEL
ALT: 17 U/L (ref 0–44)
AST: 22 U/L (ref 15–41)
Albumin: 4.1 g/dL (ref 3.5–5.0)
Alkaline Phosphatase: 81 U/L (ref 38–126)
Anion gap: 8 (ref 5–15)
BUN: 28 mg/dL — ABNORMAL HIGH (ref 8–23)
CO2: 25 mmol/L (ref 22–32)
Calcium: 10 mg/dL (ref 8.9–10.3)
Chloride: 106 mmol/L (ref 98–111)
Creatinine, Ser: 1.37 mg/dL — ABNORMAL HIGH (ref 0.44–1.00)
GFR, Estimated: 42 mL/min — ABNORMAL LOW (ref >60)
Glucose, Bld: 104 mg/dL — ABNORMAL HIGH (ref 70–99)
Potassium: 3.5 mmol/L (ref 3.5–5.1)
Sodium: 139 mmol/L (ref 135–145)
Total Bilirubin: 1.1 mg/dL (ref 0.3–1.2)
Total Protein: 8 g/dL (ref 6.5–8.1)

## 2022-07-22 LAB — LIPASE, BLOOD: Lipase: 30 U/L (ref 11–51)

## 2022-07-22 MED ORDER — ONDANSETRON 4 MG PO TBDP: 4.0000 mg | ORAL_TABLET | Freq: Three times a day (TID) | ORAL | 0 refills | Status: AC | PRN

## 2022-07-22 MED ORDER — CEPHALEXIN 500 MG PO CAPS: 500.0000 mg | ORAL_CAPSULE | Freq: Three times a day (TID) | ORAL | 0 refills | Status: AC

## 2022-07-22 MED ORDER — TAMSULOSIN HCL 0.4 MG PO CAPS: 0.4000 mg | ORAL_CAPSULE | Freq: Every day | ORAL | 0 refills | Status: DC

## 2022-07-22 MED ORDER — OXYCODONE-ACETAMINOPHEN 5-325 MG PO TABS: 1.0000 | ORAL_TABLET | Freq: Four times a day (QID) | ORAL | 0 refills | Status: DC | PRN

## 2022-07-22 MED ORDER — SODIUM CHLORIDE 0.9 % IV SOLN
1.0000 g | Freq: Once | INTRAVENOUS | Status: AC
  Administered 2022-07-22: 1 g via INTRAVENOUS
  Filled 2022-07-22: qty 10

## 2022-07-22 NOTE — ED Triage Notes (Signed)
Pt reports hematuria since yesterday. Denies any abd pain, n/v. Reports she was constipated yesterday but took a laxative and has relief from that.

## 2022-07-22 NOTE — Discharge Instructions (Addendum)
Your CT imaging does show that you have a kidney stone.  Your urine also does appear to be infected.  You are given IV antibiotics here.  I have discussed with the urologist.  They recommend close follow-up in office however if you develop fever, persistent pain uncontrolled medications at home you need to seek reevaluation in the emergency department for possible intervention of your kidney stone.  You for a few medications to help with your pain Percocet-this is a narcotic prescription.  It can make you sleepy or woozy.  Only take as needed.  Do not drive or operate heavy machinery while taking this medication.  If your pain is not severe you may take ibuprofen instead Zofran-this is a nausea medication.  It disintegrates under your tongue.  Take as needed Keflex-this is the antibiotic.  Finish until completion.  Do not miss any doses Flomax-this medication helps to dilate your urinary system to help you pass the stone.  Please use caution as this medication does dilate all vessels and may make you lightheaded when you go from sitting to standing.  I recommend dangling your feet in a chair on the edge of the bed prior to standing and not moving very quickly  Return for new or worsening symptoms

## 2022-07-22 NOTE — ED Provider Notes (Signed)
[facility] EMERGENCY DEPARTMENT Provider Note   CSN: [id number] Arrival date & time: 07/22/22  1214    History  Chief Complaint  Patient presents with   Hematuria    [name] is a 71 y.o. female with history of hypertension, PVC on baby aspirin here for evaluation of hematuria.  Has had some suprapubic pain.  Was constipated 2 days ago took OTC medication, was able to have a bowel movement without any melena or bright blood per rectum without difficulty.  States she has had some chills without documented fever.  No emesis, chest pain, shortness of breath, back pain, numbness or weakness.  She is eating and drinking without difficulty.  She has never had hematuria previously.  She denies any additional anticoagulation.  No recent injury or trauma.  She has had a prior hysterectomy.  Is not having any vaginal bleeding.  HPI     Home Medications Prior to Admission medications   Medication Sig Start Date End Date Taking? Authorizing Provider  cephALEXin (KEFLEX) 500 MG capsule Take 1 capsule (500 mg total) by mouth 3 (three) times daily for 10 days. 07/22/22 08/01/22 Yes Almalik Weissberg A, PA-C  ondansetron (ZOFRAN-ODT) 4 MG disintegrating tablet Take 1 tablet (4 mg total) by mouth every 8 (eight) hours as needed for nausea or vomiting. 07/22/22  Yes Virlee Stroschein A, PA-C  oxyCODONE-acetaminophen (PERCOCET/ROXICET) 5-325 MG tablet Take 1 tablet by mouth every 6 (six) hours as needed for severe pain. 07/22/22  Yes Lanea Vankirk A, PA-C  tamsulosin (FLOMAX) 0.4 MG CAPS capsule Take 1 capsule (0.4 mg total) by mouth daily. 07/22/22  Yes Latrail Pounders A, PA-C  aspirin 81 MG EC tablet TAKE 1 TABLET (81 MG TOTAL) BY MOUTH DAILY. Patient taking differently: Take 81 mg by mouth daily.    Zenia Resides, MD  EPINEPHrine 0.3 mg/0.3 mL IJ SOAJ injection Inject 0.3 mg into the muscle as needed for anaphylaxis. 06/15/21   Larene Pickett, PA-C  pravastatin (PRAVACHOL) 40  MG tablet TAKE 1 TABLET (40 MG TOTAL) BY MOUTH DAILY. PLEASE MAKE OVERDUE APPT FOR FUTURE REFILLS 06/25/22   Belva Crome, MD  spironolactone (ALDACTONE) 25 MG tablet TAKE 1 TABLET BY MOUTH EVERYDAY AT BEDTIME 04/30/22   Zenia Resides, MD      Allergies    Ace inhibitors, Amlodipine, and Lipitor [atorvastatin]    Review of Systems   Review of Systems  Constitutional:  Positive for chills and fatigue. Negative for activity change, appetite change, diaphoresis, fever and unexpected weight change.  HENT: Negative.    Respiratory: Negative.    Cardiovascular: Negative.   Gastrointestinal:  Positive for abdominal pain. Negative for abdominal distention, anal bleeding, blood in stool, constipation, diarrhea, nausea, rectal pain and vomiting.  Genitourinary:  Positive for frequency and hematuria. Negative for decreased urine volume, difficulty urinating, dyspareunia, dysuria, enuresis, flank pain, menstrual problem, pelvic pain, urgency, vaginal bleeding, vaginal discharge and vaginal pain.  Musculoskeletal: Negative.   Skin: Negative.   Neurological: Negative.   All other systems reviewed and are negative.   Physical Exam Updated Vital Signs BP (!) 154/79   Pulse (!) 58   Temp 98.6 F (37 C)   Resp 16   Ht '5\' 1"'$  (1.549 m)   Wt 81.6 kg   SpO2 98%   BMI 34.01 kg/m  Physical Exam Vitals and nursing note reviewed.  Constitutional:      General: She is not in acute distress.    Appearance:  She is well-developed. She is not ill-appearing, toxic-appearing or diaphoretic.  HENT:     Head: Normocephalic and atraumatic.     Nose: Nose normal.     Mouth/Throat:     Mouth: Mucous membranes are moist.  Eyes:     Pupils: Pupils are equal, round, and reactive to light.  Cardiovascular:     Rate and Rhythm: Normal rate.     Pulses: Normal pulses.     Heart sounds: Normal heart sounds.  Pulmonary:     Effort: Pulmonary effort is normal. No respiratory distress.     Breath sounds: Normal  breath sounds.  Abdominal:     General: Bowel sounds are normal. There is no distension.     Tenderness: There is no right CVA tenderness, left CVA tenderness, guarding or rebound.     Hernia: No hernia is present.     Comments: Mild tenderness suprapubic.  Negative CVA tap bilaterally.  Musculoskeletal:        General: No swelling, tenderness, deformity or signs of injury. Normal range of motion.     Cervical back: Normal range of motion.  Skin:    General: Skin is warm and dry.     Capillary Refill: Capillary refill takes less than 2 seconds.  Neurological:     General: No focal deficit present.     Mental Status: She is alert and oriented to person, place, and time.  Psychiatric:        Mood and Affect: Mood normal.     ED Results / Procedures / Treatments   Labs (all labs ordered are listed, but only abnormal results are displayed) Labs Reviewed  URINALYSIS, ROUTINE W REFLEX MICROSCOPIC - Abnormal; Notable for the following components:      Result Value   Color, Urine AMBER (*)    APPearance CLOUDY (*)    Hgb urine dipstick LARGE (*)    Protein, ur 100 (*)    Leukocytes,Ua MODERATE (*)    All other components within normal limits  URINALYSIS, MICROSCOPIC (REFLEX) - Abnormal; Notable for the following components:   Bacteria, UA MANY (*)    All other components within normal limits  COMPREHENSIVE METABOLIC PANEL - Abnormal; Notable for the following components:   Glucose, Bld 104 (*)    BUN 28 (*)    Creatinine, Ser 1.37 (*)    GFR, Estimated 42 (*)    All other components within normal limits  URINE CULTURE  CBC WITH DIFFERENTIAL/PLATELET  LIPASE, BLOOD    EKG None  Radiology CT Renal Stone Study  Result Date: 07/22/2022 CLINICAL DATA:  71 year old female presenting with flank pain. EXAM: CT ABDOMEN AND PELVIS WITHOUT CONTRAST TECHNIQUE: Multidetector CT imaging of the abdomen and pelvis was performed following the standard protocol without IV contrast.  RADIATION DOSE REDUCTION: This exam was performed according to the departmental dose-optimization program which includes automated exposure control, adjustment of the mA and/or kV according to patient size and/or use of iterative reconstruction technique. COMPARISON:  December 23, 2012. FINDINGS: Lower chest: Lung bases are clear. No effusion. No consolidative changes. Hepatobiliary: Smooth hepatic contours. No pericholecystic stranding. No gross abnormality of liver biliary tree on noncontrast imaging. Pancreas: Smooth pancreatic contours without signs of inflammation. Spleen: Normal. Adrenals/Urinary Tract: Adrenal glands are normal. Mild RIGHT hydro nephrosis and ureteral dilation in the setting of distal RIGHT ureteral calculus measuring 2-3 mm. No perivesical stranding. No LEFT-sided hydronephrosis. No additional renal calculi. Homogeneous water density lesion arising from the lateral cortex of  the LEFT kidney compatible with Bosniak category II lesion measuring 1.4 cm. Similar low-density homogeneous water density the lesion arising from the lateral interpolar RIGHT kidney measuring 1.7 cm. These are compatible with benign Bosniak category II renal cysts and no additional follow-up is recommended for these findings based on current guidelines. Stomach/Bowel: Small hiatal hernia. No signs of bowel obstruction or acute bowel process. Appendix not visualized, no secondary signs to suggest acute appendicitis. Vascular/Lymphatic: Aortic atherosclerosis. No sign of aneurysm. Smooth contour of the IVC. There is no gastrohepatic or hepatoduodenal ligament lymphadenopathy. No retroperitoneal or mesenteric lymphadenopathy. No pelvic sidewall lymphadenopathy. Limited assessment of vascular structures due to lack contrast. Reproductive: Post hysterectomy without adnexal mass. Surgical clips in the pelvis and retroperitoneum with similar appearance to prior imaging. The Other: No ascites.  No pneumoperitoneum.  Musculoskeletal: Spinal degenerative changes. No acute or destructive bone process. IMPRESSION: 1. Mild RIGHT hydronephrosis and ureteral dilation in the setting of a 2-3 mm distal RIGHT ureteral calculus. 2. Small hiatal hernia. 3. Aortic atherosclerosis. Aortic Atherosclerosis (ICD10-I70.0). Electronically Signed   By: Zetta Bills M.D.   On: 07/22/2022 17:15    Procedures Procedures    Medications Ordered in ED Medications  cefTRIAXone (ROCEPHIN) 1 g in sodium chloride 0.9 % 100 mL IVPB (1 g Intravenous New Bag/Given 07/22/22 1539)    ED Course/ Medical Decision Making/ A&P    71 year old here for evaluation of hematuria, suprapubic abdominal pain which began yesterday.  Initially thought she was constipated took a laxative which helped.  Has had some chills without documented fever.  No bloody stools.  Takes a baby aspirin however no other anticoagulation.  No vaginal bleeding or recent traumatic injury.  Plan on labs and imaging  Labs and imaging personally viewed and interpreted:  UA positive for infection will give Rocephin CBC no leukocytosis, hemoglobin 13.0 CMP creatinine 1.37, mild AKI, last 1.07 Lipase 30 CT Stone 2-3 mm distal right ureteral stone with mild hydronephrosis  Reassessed.  Has not needed anything for pain.  No vomiting.  Given possible infected stone will discuss with urology  CONSULT with Dr. Abner Greenspan with Urology who rec treat with p.o. antibiotics, strict return precautions if she develops fever, uncontrolled pain would need intervention at that time for stone otherwise may follow-up with urology in the outpatient setting after ABX.  I discussed results with patient.  She has not needed any medications for pain or nausea here in the emergency department.  She is currently asymptomatic aside from hematuria.  She is urinating without difficulty and feels like she is able to fully empty her bladder.  I encouraged close follow-up with urology, return for new or  worsening symptoms which she is agreeable for  The patient has been appropriately medically screened and/or stabilized in the ED. I have low suspicion for any other emergent medical condition which would require further screening, evaluation or treatment in the ED or require inpatient management.  Patient is hemodynamically stable and in no acute distress.  Patient able to ambulate in department prior to ED.  Evaluation does not show acute pathology that would require ongoing or additional emergent interventions while in the emergency department or further inpatient treatment.  I have discussed the diagnosis with the patient and answered all questions.  Pain is been managed while in the emergency department and patient has no further complaints prior to discharge.  Patient is comfortable with plan discussed in room and is stable for discharge at this time.  I have discussed  strict return precautions for returning to the emergency department.  Patient was encouraged to follow-up with PCP/specialist refer to at discharge.                                Medical Decision Making Amount and/or Complexity of Data Reviewed External Data Reviewed: labs, radiology and notes. Labs: ordered. Decision-making details documented in ED Course. Radiology: ordered and independent interpretation performed. Decision-making details documented in ED Course.  Risk OTC drugs. Prescription drug management. Parenteral controlled substances. Diagnosis or treatment significantly limited by social determinants of health.          Final Clinical Impression(s) / ED Diagnoses Final diagnoses:  Acute cystitis with hematuria  Ureteral stone with hydronephrosis    Rx / DC Orders ED Discharge Orders          Ordered    cephALEXin (KEFLEX) 500 MG capsule  3 times daily        07/22/22 1752    tamsulosin (FLOMAX) 0.4 MG CAPS capsule  Daily        07/22/22 1752    oxyCODONE-acetaminophen (PERCOCET/ROXICET)  5-325 MG tablet  Every 6 hours PRN        07/22/22 1752    ondansetron (ZOFRAN-ODT) 4 MG disintegrating tablet  Every 8 hours PRN        07/22/22 1752              Dusty Raczkowski A, PA-C 07/22/22 1754    Gareth Morgan, MD 07/23/22 1119

## 2022-07-24 LAB — URINE CULTURE: Culture: NO GROWTH

## 2022-07-25 ENCOUNTER — Emergency Department (HOSPITAL_COMMUNITY)
Admission: EM | Admit: 2022-07-25 | Discharge: 2022-07-25 | Disposition: A | Discharge status: Home / Self Care | Payer: Medicare HMO | Attending: Emergency Medicine | Admitting: Emergency Medicine

## 2022-07-25 ENCOUNTER — Emergency Department (HOSPITAL_COMMUNITY): Payer: Medicare HMO

## 2022-07-25 ENCOUNTER — Other Ambulatory Visit: Payer: Self-pay

## 2022-07-25 ENCOUNTER — Encounter (HOSPITAL_COMMUNITY): Payer: Self-pay

## 2022-07-25 DIAGNOSIS — R0789 Other chest pain: Secondary | ICD-10-CM | POA: Diagnosis not present

## 2022-07-25 DIAGNOSIS — N132 Hydronephrosis with renal and ureteral calculous obstruction: Secondary | ICD-10-CM | POA: Diagnosis not present

## 2022-07-25 DIAGNOSIS — R110 Nausea: Secondary | ICD-10-CM | POA: Diagnosis not present

## 2022-07-25 DIAGNOSIS — Z7982 Long term (current) use of aspirin: Secondary | ICD-10-CM | POA: Insufficient documentation

## 2022-07-25 DIAGNOSIS — R079 Chest pain, unspecified: Secondary | ICD-10-CM | POA: Diagnosis not present

## 2022-07-25 DIAGNOSIS — R109 Unspecified abdominal pain: Secondary | ICD-10-CM | POA: Diagnosis present

## 2022-07-25 DIAGNOSIS — I10 Essential (primary) hypertension: Secondary | ICD-10-CM | POA: Diagnosis not present

## 2022-07-25 DIAGNOSIS — N281 Cyst of kidney, acquired: Secondary | ICD-10-CM | POA: Diagnosis not present

## 2022-07-25 DIAGNOSIS — D1800 Hemangioma unspecified site: Secondary | ICD-10-CM | POA: Diagnosis not present

## 2022-07-25 DIAGNOSIS — K449 Diaphragmatic hernia without obstruction or gangrene: Secondary | ICD-10-CM | POA: Diagnosis not present

## 2022-07-25 DIAGNOSIS — N200 Calculus of kidney: Secondary | ICD-10-CM | POA: Diagnosis not present

## 2022-07-25 DIAGNOSIS — N23 Unspecified renal colic: Secondary | ICD-10-CM | POA: Insufficient documentation

## 2022-07-25 LAB — URINALYSIS, ROUTINE W REFLEX MICROSCOPIC
Bacteria, UA: NONE SEEN
Bilirubin Urine: NEGATIVE
Glucose, UA: NEGATIVE mg/dL
Ketones, ur: NEGATIVE mg/dL
Leukocytes,Ua: NEGATIVE
Nitrite: NEGATIVE
Protein, ur: NEGATIVE mg/dL
Specific Gravity, Urine: 1.011 (ref 1.005–1.030)
pH: 5 (ref 5.0–8.0)

## 2022-07-25 LAB — BASIC METABOLIC PANEL
Anion gap: 7 (ref 5–15)
BUN: 27 mg/dL — ABNORMAL HIGH (ref 8–23)
CO2: 22 mmol/L (ref 22–32)
Calcium: 9.5 mg/dL (ref 8.9–10.3)
Chloride: 108 mmol/L (ref 98–111)
Creatinine, Ser: 1.51 mg/dL — ABNORMAL HIGH (ref 0.44–1.00)
GFR, Estimated: 37 mL/min — ABNORMAL LOW (ref >60)
Glucose, Bld: 127 mg/dL — ABNORMAL HIGH (ref 70–99)
Potassium: 4.1 mmol/L (ref 3.5–5.1)
Sodium: 137 mmol/L (ref 135–145)

## 2022-07-25 LAB — CBC
HCT: 35.6 % — ABNORMAL LOW (ref 36.0–46.0)
Hemoglobin: 12.4 g/dL (ref 12.0–15.0)
MCH: 30.4 pg (ref 26.0–34.0)
MCHC: 34.8 g/dL (ref 30.0–36.0)
MCV: 87.3 fL (ref 80.0–100.0)
Platelets: 211 10*3/uL (ref 150–400)
RBC: 4.08 MIL/uL (ref 3.87–5.11)
RDW: 13.3 % (ref 11.5–15.5)
WBC: 8.1 10*3/uL (ref 4.0–10.5)
nRBC: 0 % (ref 0.0–0.2)

## 2022-07-25 MED ORDER — HYDROMORPHONE HCL 1 MG/ML IJ SOLN
1.0000 mg | Freq: Once | INTRAMUSCULAR | Status: AC
  Administered 2022-07-25: 1 mg via INTRAVENOUS
  Filled 2022-07-25: qty 1

## 2022-07-25 MED ORDER — ONDANSETRON HCL 4 MG/2ML IJ SOLN
4.0000 mg | Freq: Once | INTRAMUSCULAR | Status: AC
  Administered 2022-07-25: 4 mg via INTRAVENOUS
  Filled 2022-07-25: qty 2

## 2022-07-25 MED ORDER — KETOROLAC TROMETHAMINE 15 MG/ML IJ SOLN
15.0000 mg | Freq: Once | INTRAMUSCULAR | Status: AC
  Administered 2022-07-25: 15 mg via INTRAVENOUS
  Filled 2022-07-25: qty 1

## 2022-07-25 MED ORDER — SODIUM CHLORIDE 0.9 % IV BOLUS
500.0000 mL | Freq: Once | INTRAVENOUS | Status: AC
  Administered 2022-07-25: 500 mL via INTRAVENOUS

## 2022-07-25 NOTE — ED Provider Triage Note (Signed)
Emergency Medicine Provider Triage Evaluation Note  [name] , a 71 y.o. female  was evaluated in triage.  Pt complains of kidney stone pain.  She was here 2 days ago and diagnosed with right-sided nephrolithiasis.  This morning she saw 2 small stones in the toilet but she is still having severe right flank and suprapubic pain.  Took oxycodone just prior to Review of Systems  Positive: Suprapubic pain, flank pain, urinary hesitancy Negative: Fevers, chills, hematuria  Physical Exam  BP (!) 187/77   Pulse 62   Temp 99.7 F (37.6 C)   Resp 17   Ht '5\' 1"'$  (1.549 m)   Wt 81.6 kg   SpO2 99%   BMI 34.01 kg/m  Gen:   Awake, no distress   Resp:  Normal effort  MSK:   Moves extremities without difficulty  Other:  Positive right-sided CVA tenderness.  Suprapubic tenderness.  Medical Decision Making  Medically screening exam initiated at 3:14 PM.  Appropriate orders placed.  [name] was informed that the remainder of the evaluation will be completed by another provider, this initial triage assessment does not replace that evaluation, and the importance of remaining in the ED until their evaluation is complete.   We will redo stone work-up   Miriam Kestler A, PA-C 07/25/22 1528

## 2022-07-25 NOTE — Discharge Instructions (Addendum)
You have stone in the bladder and it will pass   Take motrin for pain   Continue flomax and percocet and zofran as prescribed earlier  See urology for follow up   Return to ER if you have worse abdominal pain, vomiting

## 2022-07-25 NOTE — ED Provider Notes (Signed)
[facility] DEPT Provider Note   CSN: [id number] Arrival date & time: 07/25/22  1451     History  Chief Complaint  Patient presents with   Abdominal Pain   Nephrolithiasis    [name] is a 71 y.o. female history of kidney stone here presenting with abdominal pain and hematuria.  Patient was seen in the ED 3 days ago and was diagnosed with renal colic.  She had 3 mm stone with hydro-.  She was sent home with pain medicine and also antibiotics since she has some bacteria in her urine.  Patient states that she was feeling better initially and then she had worsening cramps today.  She has persistent hematuria.  She looked at the toilet and there were 2 small stones that came out.  She states that after the stone came out, she has worsening cramps and flank pain.  She also has vomiting as well.  The history is provided by the patient.       Home Medications Prior to Admission medications   Medication Sig Start Date End Date Taking? Authorizing Provider  aspirin 81 MG EC tablet TAKE 1 TABLET (81 MG TOTAL) BY MOUTH DAILY. Patient taking differently: Take 81 mg by mouth daily.    Zenia Resides, MD  cephALEXin (KEFLEX) 500 MG capsule Take 1 capsule (500 mg total) by mouth 3 (three) times daily for 10 days. 07/22/22 08/01/22  Henderly, Britni A, PA-C  EPINEPHrine 0.3 mg/0.3 mL IJ SOAJ injection Inject 0.3 mg into the muscle as needed for anaphylaxis. 06/15/21   Larene Pickett, PA-C  ondansetron (ZOFRAN-ODT) 4 MG disintegrating tablet Take 1 tablet (4 mg total) by mouth every 8 (eight) hours as needed for nausea or vomiting. 07/22/22   Henderly, Britni A, PA-C  oxyCODONE-acetaminophen (PERCOCET/ROXICET) 5-325 MG tablet Take 1 tablet by mouth every 6 (six) hours as needed for severe pain. 07/22/22   Henderly, Britni A, PA-C  pravastatin (PRAVACHOL) 40 MG tablet TAKE 1 TABLET (40 MG TOTAL) BY MOUTH DAILY. PLEASE MAKE OVERDUE APPT FOR FUTURE REFILLS 06/25/22    Belva Crome, MD  spironolactone (ALDACTONE) 25 MG tablet TAKE 1 TABLET BY MOUTH EVERYDAY AT BEDTIME 04/30/22   Zenia Resides, MD  tamsulosin (FLOMAX) 0.4 MG CAPS capsule Take 1 capsule (0.4 mg total) by mouth daily. 07/22/22   Henderly, Britni A, PA-C      Allergies    Ace inhibitors, Amlodipine, and Lipitor [atorvastatin]    Review of Systems   Review of Systems  Gastrointestinal:  Positive for abdominal pain.  Genitourinary:  Positive for flank pain and hematuria.  All other systems reviewed and are negative.   Physical Exam Updated Vital Signs BP (!) 201/69   Pulse 60   Temp 98.9 F (37.2 C) (Oral)   Resp 18   Ht '5\' 1"'$  (1.549 m)   Wt 81.6 kg   SpO2 100%   BMI 34.01 kg/m  Physical Exam Vitals and nursing note reviewed.  Constitutional:      Comments: Uncomfortable   HENT:     Head: Normocephalic.     Mouth/Throat:     Mouth: Mucous membranes are moist.  Eyes:     Extraocular Movements: Extraocular movements intact.     Pupils: Pupils are equal, round, and reactive to light.  Cardiovascular:     Rate and Rhythm: Normal rate and regular rhythm.  Pulmonary:     Effort: Pulmonary effort is normal.     Breath sounds:  Normal breath sounds.  Abdominal:     General: Abdomen is flat.     Palpations: Abdomen is soft.     Comments: + R CVAT   Skin:    General: Skin is warm.  Neurological:     General: No focal deficit present.  Psychiatric:        Mood and Affect: Mood normal.     ED Results / Procedures / Treatments   Labs (all labs ordered are listed, but only abnormal results are displayed) Labs Reviewed  BASIC METABOLIC PANEL - Abnormal; Notable for the following components:      Result Value   Glucose, Bld 127 (*)    BUN 27 (*)    Creatinine, Ser 1.51 (*)    GFR, Estimated 37 (*)    All other components within normal limits  CBC - Abnormal; Notable for the following components:   HCT 35.6 (*)    All other components within normal limits   URINALYSIS, ROUTINE W REFLEX MICROSCOPIC - Abnormal; Notable for the following components:   Color, Urine STRAW (*)    Hgb urine dipstick MODERATE (*)    All other components within normal limits    EKG None  Radiology CT RENAL STONE STUDY  Result Date: 07/25/2022 CLINICAL DATA:  Nephrolithiasis right flank pain, nausea and vomiting; felt like she was going to pass out EXAM: CT ABDOMEN AND PELVIS WITHOUT CONTRAST TECHNIQUE: Multidetector CT imaging of the abdomen and pelvis was performed following the standard protocol without IV contrast. RADIATION DOSE REDUCTION: This exam was performed according to the departmental dose-optimization program which includes automated exposure control, adjustment of the mA and/or kV according to patient size and/or use of iterative reconstruction technique. COMPARISON:  July 22, 2022 FINDINGS: Lower chest: No acute abnormality. Hepatobiliary: No focal liver abnormality is seen. No gallstones, gallbladder wall thickening, or biliary dilatation. Pancreas: Unremarkable. No pancreatic ductal dilatation or surrounding inflammatory changes. Spleen: Normal in size without focal abnormality. Adrenals/Urinary Tract: There is moderate right hydronephrosis and hydroureter seen with a 3 mm calculus at the distal most aspect of the vesicoureteral junction/base of the bladder. Mild right perinephric stranding. Stable bilateral renal cysts. Stomach/Bowel: Small hiatal hernia. Appendix is not visualized. Moderate stool burden in the colon. Bowel-gas pattern is nonobstructive. Vascular/Lymphatic: No significant vascular findings are present. Mild atheromatous calcifications of the abdominal aorta. No enlarged abdominal or pelvic lymph nodes. Reproductive: Status post hysterectomy.  No adnexal masses. Other: No abdominal wall hernia or abnormality. No abdominopelvic ascites. Musculoskeletal: Hemangioma of the L2 vertebral body. Mild thoracolumbar spondylosis. IMPRESSION: 1. There has  been interval migration of the distal right ureteral calculus to the distal most aspect of the vesicoureteral junction/dependent aspect of the bladder. There is some perinephric stranding seen at the right side. 2.  Small hiatal hernia. Electronically Signed   By: Frazier Richards M.D.   On: 07/25/2022 17:43    Procedures Procedures    Medications Ordered in ED Medications  HYDROmorphone (DILAUDID) injection 1 mg (1 mg Intravenous Given 07/25/22 2108)  ketorolac (TORADOL) 15 MG/ML injection 15 mg (15 mg Intravenous Given 07/25/22 2108)  ondansetron (ZOFRAN) injection 4 mg (4 mg Intravenous Given 07/25/22 2108)  sodium chloride 0.9 % bolus 500 mL (500 mLs Intravenous New Bag/Given 07/25/22 2110)    ED Course/ Medical Decision Making/ A&P                           Medical Decision Making  Jemila Camille is a 71 y.o. female here presenting with flank pain and passed kidney stone. Recently seen for renal colic. Passed two stones and has persistent pain. Will get repeat CT renal stone to see if she has retained stone or persistent hydro. Will get labs, UA, CT renal stone. Will give pain meds and zofran and reassess   9:56 PM I reviewed patient's labs and independently interpreted CT scan.  Patient's creatinine went up to 1.5 from 1.2.  UA showed no obvious UTI.  CT showed small stone now in the bladder.  There is still some hydro on the right side.  Her pain is much better after Toradol and pain meds.  She still has oxycodone at home and also Zofran.  At this point I told her that she likely will pass the rest of the stones in her bladder.  Recommend ibuprofen in addition to her oxycodone for pain.  Problems Addressed: Renal colic on right side: acute illness or injury  Amount and/or Complexity of Data Reviewed Labs: ordered. Decision-making details documented in ED Course. Radiology: ordered and independent interpretation performed. Decision-making details documented in ED Course.  Risk Prescription  drug management.   Final Clinical Impression(s) / ED Diagnoses Final diagnoses:  None    Rx / DC Orders ED Discharge Orders     None         Drenda Freeze, MD 07/25/22 2157

## 2022-07-25 NOTE — ED Triage Notes (Signed)
Pt arrives via ems from home. Pt was diagnosed with kidney stones on the 30th. Pt reports increased nausea and vomiting, felt like she was going to pass out.

## 2022-08-01 ENCOUNTER — Telehealth: Payer: Self-pay | Admitting: *Deleted

## 2022-08-01 NOTE — Telephone Encounter (Signed)
        Patient  visited [facility] ed on  07/23/2022 for cystitus   Telephone encounter attempt :  !st  A HIPAA compliant voice message was left requesting a return call.  Instructed patient to call back at [phone].  [facility] [phone] [address] , [place] [address] Email : [email or web address]

## 2022-08-02 ENCOUNTER — Telehealth: Payer: Self-pay | Admitting: *Deleted

## 2022-08-10 ENCOUNTER — Other Ambulatory Visit: Payer: Self-pay | Admitting: Interventional Cardiology

## 2022-08-10 ENCOUNTER — Other Ambulatory Visit: Payer: Self-pay | Admitting: Family Medicine

## 2022-08-10 DIAGNOSIS — E7800 Pure hypercholesterolemia, unspecified: Secondary | ICD-10-CM

## 2022-08-10 DIAGNOSIS — Z1231 Encounter for screening mammogram for malignant neoplasm of breast: Secondary | ICD-10-CM

## 2022-08-21 ENCOUNTER — Other Ambulatory Visit: Payer: Self-pay | Admitting: Interventional Cardiology

## 2022-08-21 DIAGNOSIS — E7800 Pure hypercholesterolemia, unspecified: Secondary | ICD-10-CM

## 2022-09-05 ENCOUNTER — Ambulatory Visit: Payer: Medicare HMO

## 2022-09-06 ENCOUNTER — Other Ambulatory Visit: Payer: Self-pay | Admitting: Interventional Cardiology

## 2022-09-06 DIAGNOSIS — E7800 Pure hypercholesterolemia, unspecified: Secondary | ICD-10-CM

## 2022-09-22 ENCOUNTER — Other Ambulatory Visit: Payer: Self-pay | Admitting: Interventional Cardiology

## 2022-09-22 DIAGNOSIS — E7800 Pure hypercholesterolemia, unspecified: Secondary | ICD-10-CM

## 2022-10-15 ENCOUNTER — Ambulatory Visit
Admission: RE | Admit: 2022-10-15 | Discharge: 2022-10-15 | Disposition: A | Discharge status: Home / Self Care | Payer: Medicare HMO | Source: Ambulatory Visit | Attending: Family Medicine | Admitting: Family Medicine

## 2022-10-15 DIAGNOSIS — Z1231 Encounter for screening mammogram for malignant neoplasm of breast: Secondary | ICD-10-CM | POA: Diagnosis not present

## 2022-11-09 ENCOUNTER — Other Ambulatory Visit: Payer: Self-pay | Admitting: Interventional Cardiology

## 2022-11-09 DIAGNOSIS — E7800 Pure hypercholesterolemia, unspecified: Secondary | ICD-10-CM

## 2022-11-26 ENCOUNTER — Other Ambulatory Visit: Payer: Self-pay | Admitting: Interventional Cardiology

## 2022-11-26 DIAGNOSIS — E7800 Pure hypercholesterolemia, unspecified: Secondary | ICD-10-CM

## 2023-01-16 ENCOUNTER — Other Ambulatory Visit: Payer: Self-pay

## 2023-01-16 DIAGNOSIS — E7800 Pure hypercholesterolemia, unspecified: Secondary | ICD-10-CM

## 2023-01-16 MED ORDER — PRAVASTATIN SODIUM 40 MG PO TABS: ORAL_TABLET | ORAL | 0 refills | Status: DC

## 2023-01-22 NOTE — Progress Notes (Unsigned)
Cardiology Office Note:    Date:  01/23/2023   ID:  [name], DOB 1951/06/09, MRN [id number]  PCP:  Zenia Resides, MD   [facility] Prov[facility] Cardiologist:  Werner Lean, MD     Referring MD: Zenia Resides, MD   Consulted for the evaluation of HTN at the behest of Dr. Andria Frames  History of Present Illness:    [name] is a 72 y.o. female with a hx of HTN, PVCs.  And PACs.  HLD with statin myopathy. Last seen 2021 for HTN emergency.  Has been lost to f/u since.  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   Ran out pravastatin, and came back in. Doing well.   Two months had ED visit for kidney stones.  Ate some pizza yesterday and has had elevated blood pressure.  No chest pain or pressure .  No SOB/DOE and no PND/Orthopnea.  No weight gain or leg swelling.  No palpitations or syncope.  Ambulatory blood pressure 145/81.   Past Medical History:  Diagnosis Date   Hypertension     Past Surgical History:  Procedure Laterality Date   ABDOMINAL HYSTERECTOMY      Current Medications: Current Meds  Medication Sig   aspirin 81 MG EC tablet TAKE 1 TABLET (81 MG TOTAL) BY MOUTH DAILY.   EPINEPHrine 0.3 mg/0.3 mL IJ SOAJ injection Inject 0.3 mg into the muscle as needed for anaphylaxis.   hydrochlorothiazide (MICROZIDE) 12.5 MG capsule Take 1 capsule (12.5 mg total) by mouth daily.   ondansetron (ZOFRAN-ODT) 4 MG disintegrating tablet Take 1 tablet (4 mg total) by mouth every 8 (eight) hours as needed for nausea or vomiting.   pravastatin (PRAVACHOL) 40 MG tablet Take 1 tablet (40 mg total) by mouth every evening.   spironolactone (ALDACTONE) 25 MG tablet TAKE 1 TABLET BY MOUTH EVERYDAY AT BEDTIME     Allergies:   Ace inhibitors, Amlodipine, and Lipitor [atorvastatin]   Social History   Socioeconomic History   Marital status: Divorced    Spouse name: Not on file   Number of children: Not on file   Years of  education: Not on file   Highest education level: Not on file  Occupational History   Not on file  Tobacco Use   Smoking status: Former    Years: 5.00    Types: Cigarettes    Quit date: 08/16/1989    Years since quitting: 33.4   Smokeless tobacco: Never  Vaping Use   Vaping Use: Never used  Substance and Sexual Activity   Alcohol use: No   Drug use: No   Sexual activity: Never  Other Topics Concern   Not on file  Social History Narrative   Not on file   Social Determinants of Health   Financial Resource Strain: Not on file  Food Insecurity: Not on file  Transportation Needs: Not on file  Physical Activity: Not on file  Stress: Not on file  Social Connections: Not on file     Family History: Family history of MI sister and mother (both deceased) Father with prior MI.  ROS:   Please see the history of present illness.     All other systems reviewed and are negative.  EKGs/Labs/Other Studies Reviewed:    The following studies were reviewed today:  EKG:  EKG is  ordered today.  The ekg ordered today demonstrates  01/23/23: SR with inferior TWI  Cardiac Studies & Procedures  ECHOCARDIOGRAM  ECHOCARDIOGRAM COMPLETE 09/25/2017  Narrative *[facility]* [address], [place] [address] [phone]  ------------------------------------------------------------------- Transthoracic Echocardiography  Patient:    [name], [name] MR #:       [id number] Study Date: 09/25/2017 Gender:     F Age:        7 Height:     154.9 cm Weight:     79.6 kg BSA:        1.89 m^2 Pt. Status: Room:  Ivar Bury, MD REFERRING    Belva Crome, MD REFERRING    Zenia Resides ATTENDING    Candee Furbish, M.D. SONOGRAPHER  Marygrace Drought, RCS PERFORMING   [facility], Outpatient  cc:  ------------------------------------------------------------------- LV EF: 55% -    60%  ------------------------------------------------------------------- Indications:      Cardiomegaly (I51.7).  ------------------------------------------------------------------- History:   Risk factors:  Hypertension.  ------------------------------------------------------------------- Study Conclusions  - Left ventricle: The cavity size was normal. Wall thickness was normal. Systolic function was normal. The estimated ejection fraction was in the range of 55% to 60%. Wall motion was normal; there were no regional wall motion abnormalities. Doppler parameters are consistent with abnormal left ventricular relaxation (grade 1 diastolic dysfunction). - Mitral valve: Calcified annulus. Valve area by pressure half-time: 1.76 cm^2. - Left atrium: The atrium was mildly dilated. Volume/bsa, ES, (1-plane Simpson&'s, A2C): 41 ml/m^2.  ------------------------------------------------------------------- Study data:  Comparison was made to the study of 01/11/2014.  Study status:  Routine.  Procedure:  The patient reported no pain pre or post test. Transthoracic echocardiography. Image quality was adequate.          Transthoracic echocardiography.  M-mode, complete 2D, spectral Doppler, and color Doppler.  Birthdate: Patient birthdate: [birth year].  Age:  Patient is 72 yr old.  Sex: Gender: female.    BMI: 33.2 kg/m^2.  Blood pressure:     170/94 Patient status:  Outpatient.  Study date:  Study date: 09/25/2017. Study time: 10:09 AM.  Location:  [facility]  -------------------------------------------------------------------  ------------------------------------------------------------------- Left ventricle:  The cavity size was normal. Wall thickness was normal. Systolic function was normal. The estimated ejection fraction was in the range of 55% to 60%. Wall motion was normal; there were no regional wall motion abnormalities. Doppler parameters are consistent with abnormal left  ventricular relaxation (grade 1 diastolic dysfunction).  ------------------------------------------------------------------- Aortic valve:   Trileaflet; normal thickness leaflets. Mobility was not restricted.  Doppler:  Transvalvular velocity was within the normal range. There was no stenosis. There was no regurgitation.  ------------------------------------------------------------------- Aorta:  Aortic root: The aortic root was normal in size.  ------------------------------------------------------------------- Mitral valve:   Calcified annulus. Mobility was not restricted. Doppler:  Transvalvular velocity was within the normal range. There was no evidence for stenosis. There was trivial regurgitation. Valve area by pressure half-time: 1.76 cm^2. Indexed valve area by pressure half-time: 0.93 cm^2/m^2.  ------------------------------------------------------------------- Left atrium:  The atrium was mildly dilated.  ------------------------------------------------------------------- Right ventricle:  The cavity size was normal. Wall thickness was normal. Systolic function was normal.  ------------------------------------------------------------------- Pulmonic valve:    Doppler:  Transvalvular velocity was within the normal range. There was no evidence for stenosis.  ------------------------------------------------------------------- Tricuspid valve:   Structurally normal valve.    Doppler: Transvalvular velocity was within the normal range. There was no regurgitation.  ------------------------------------------------------------------- Pulmonary artery:   The main pulmonary artery was normal-sized. Systolic pressure was within the normal range.  ------------------------------------------------------------------- Right atrium:  The atrium was normal  in size.  ------------------------------------------------------------------- Pericardium:  There was no pericardial  effusion.  ------------------------------------------------------------------- Systemic veins: Inferior vena cava: The vessel was normal in size. The respirophasic diameter changes were in the normal range (= 50%), consistent with normal central venous pressure. Diameter: 12 mm.  ------------------------------------------------------------------- Measurements  IVC                                      Value          Reference ID                                       12    mm       ----------  Left ventricle                           Value          Reference LV ID, ED, PLAX chordal                  51    mm       43 - 52 LV ID, ES, PLAX chordal                  38    mm       23 - 38 LV fx shortening, PLAX chordal (L)       25    %        >=29 LV PW thickness, ED                      11    mm       ---------- IVS/LV PW ratio, ED                      0.91           <=1.3 Stroke volume, 2D                        62    ml       ---------- Stroke volume/bsa, 2D                    33    ml/m^2   ---------- LV e&', lateral                           5     cm/s     ---------- LV E/e&', lateral                         12.42          ---------- LV e&', medial                            4.03  cm/s     ---------- LV E/e&', medial                          15.41          ---------- LV e&', average  4.52  cm/s     ---------- LV E/e&', average                         13.75          ----------  Ventricular septum                       Value          Reference IVS thickness, ED                        10    mm       ----------  LVOT                                     Value          Reference LVOT ID, S                               20    mm       ---------- LVOT area                                3.14  cm^2     ---------- LVOT peak velocity, S                    83.5  cm/s     ---------- LVOT mean velocity, S                    54.1  cm/s     ---------- LVOT VTI, S                               19.7  cm       ----------  Aorta                                    Value          Reference Aortic root ID, ED                       33    mm       ----------  Left atrium                              Value          Reference LA ID, A-P, ES                           41    mm       ---------- LA ID/bsa, A-P                           2.18  cm/m^2   <=2.2 LA volume, S                             66  ml       ---------- LA volume/bsa, S                         35    ml/m^2   ---------- LA volume, ES, 1-p A4C                   51.5  ml       ---------- LA volume/bsa, ES, 1-p A4C               27.3  ml/m^2   ---------- LA volume, ES, 1-p A2C                   77.3  ml       ---------- LA volume/bsa, ES, 1-p A2C               41    ml/m^2   ----------  Mitral valve                             Value          Reference Mitral E-wave peak velocity              62.1  cm/s     ---------- Mitral A-wave peak velocity              83.4  cm/s     ---------- Mitral deceleration time       (H)       426   ms       150 - 230 Mitral pressure half-time                125   ms       ---------- Mitral E/A ratio, peak                   0.7            ---------- Mitral valve area, PHT, DP               1.76  cm^2     ---------- Mitral valve area/bsa, PHT, DP           0.93  cm^2/m^2 ----------  Right atrium                             Value          Reference RA ID, S-I, ES, A4C                      46.4  mm       34 - 49 RA area, ES, A4C                         15    cm^2     8.3 - 19.5 RA volume, ES, A/L                       40.9  ml       ---------- RA volume/bsa, ES, A/L                   21.7  ml/m^2   ----------  Right ventricle  Value          Reference TAPSE                                    22.6  mm       ---------- RV s&', lateral, S                        9.46  cm/s     ----------  Legend: (L)  and  (H)  mark values outside specified reference  range.  ------------------------------------------------------------------- Prepared and Electronically Authenticated by  Candee Furbish, M.D. 2018-10-03T15:14:03              Recent Labs: 07/22/2022: ALT 17 07/25/2022: BUN 27; Creatinine, Ser 1.51; Hemoglobin 12.4; Platelets 211; Potassium 4.1; Sodium 137  Recent Lipid Panel    Component Value Date/Time   CHOL 168 05/17/2021 1448   TRIG 196 (H) 05/17/2021 1448   HDL 21 (L) 05/17/2021 1448   CHOLHDL 8.0 (H) 05/17/2021 1448   CHOLHDL 4.3 01/11/2014 0250   VLDL 11 01/11/2014 0250   LDLCALC 112 (H) 05/17/2021 1448   LDLDIRECT 140 (H) 08/17/2011 0949     Risk Assessment/Calculations:     HYPERTENSION CONTROL Vitals:   01/23/23 1403 01/23/23 1434  BP: (!) 152/80 (!) 156/82    The patient's blood pressure is elevated above target today.  In order to address the patient's elevated BP: A new medication was prescribed today.         Physical Exam:    VS:  BP (!) 156/82   Pulse 66   Ht '5\' 2"'$  (1.575 m)   Wt 172 lb (78 kg)   SpO2 98%   BMI 31.46 kg/m     Wt Readings from Last 3 Encounters:  01/23/23 172 lb (78 kg)  07/25/22 180 lb (81.6 kg)  07/22/22 180 lb (81.6 kg)    GEN:  Well nourished, well developed in no acute distress HEENT: Normal NECK: No JVD CARDIAC: RRR, no murmurs, rubs, gallops RESPIRATORY:  Clear to auscultation without rales, wheezing or rhonchi  ABDOMEN: Soft, non-tender, non-distended MUSCULOSKELETAL:  No edema; No deformity  SKIN: Warm and dry NEUROLOGIC:  Alert and oriented x 3 PSYCHIATRIC:  Normal affect   ASSESSMENT:    1. Essential hypertension   2. Mixed hyperlipidemia   3. Aortic atherosclerosis (HCC)   4. Statin myopathy    PLAN:    HTN with [place] HTN CKD Stage IIIa - with amlodipine lip swelling - with ACEi inhibitor AKI - discussed dietary changes - continue AMB BP monitoring - will start HCTZ 12.5 mg PO Daily and BMP in 1-2 weeks  HLD with statin myopathy  (atorvastatin muscle aches) Aortic atherosclerosis - will restart pravastatin 40 mg and check labs in three months  I do not have a strong indication ASA, will continue discussion for DC in subsequent visits      Medication Adjustments/Labs and Tests Ordered: Current medicines are reviewed at length with the patient today.  Concerns regarding medicines are outlined above.  Orders Placed This Encounter  Procedures   Basic metabolic panel   EKG [id number]   Meds ordered this encounter  Medications   hydrochlorothiazide (MICROZIDE) 12.5 MG capsule    Sig: Take 1 capsule (12.5 mg total) by mouth daily.    Dispense:  90 capsule    Refill:  3   pravastatin (PRAVACHOL)  40 MG tablet    Sig: Take 1 tablet (40 mg total) by mouth every evening.    Dispense:  90 tablet    Refill:  3    Patient Instructions  Medication Instructions:  Your physician has recommended you make the following change in your medication:  START: hydrochlorothiazide (HCTZ) 12.5 mg by mouth once daily  TAKE: pravastatin 40 mg by mouth once daily  *If you need a refill on your cardiac medications before your next appointment, please call your pharmacy*   Lab Work: IN 1-2 WEEKS: BMP  If you have labs (blood work) drawn today and your tests are completely normal, you will receive your results only by: [place] (if you have MyChart) OR A paper copy in the mail If you have any lab test that is abnormal or we need to change your treatment, we will call you to review the results.   Testing/Procedures: NONE   Follow-Up: At [facility], you and your health needs are our priority.  As part of our continuing mission to provide you with exceptional heart care, we have created designated Provider Care Teams.  These Care Teams include your primary Cardiologist (physician) and Advanced Practice Providers (APPs -  Physician Assistants and Nurse Practitioners) who all work together to provide you with the  care you need, when you need it.  We recommend signing up for the patient portal called "MyChart".  Sign up information is provided on this After Visit Summary.  MyChart is used to connect with patients for Virtual Visits (Telemedicine).  Patients are able to view lab/test results, encounter notes, upcoming appointments, etc.  Non-urgent messages can be sent to your provider as well.   To learn more about what you can do with MyChart, go to [email or web address].    Your next appointment:   6 month(s)  Provider:   Werner Lean, MD      Signed, Werner Lean, MD  01/23/2023 2:46 PM    [place]

## 2023-01-23 ENCOUNTER — Encounter: Payer: Self-pay | Admitting: Internal Medicine

## 2023-01-23 ENCOUNTER — Ambulatory Visit: Payer: Medicare HMO | Attending: Internal Medicine | Admitting: Internal Medicine

## 2023-01-23 VITALS — BP 156/82 | HR 66 | Ht 62.0 in | Wt 172.0 lb

## 2023-01-23 DIAGNOSIS — I10 Essential (primary) hypertension: Secondary | ICD-10-CM

## 2023-01-23 DIAGNOSIS — E782 Mixed hyperlipidemia: Secondary | ICD-10-CM | POA: Diagnosis not present

## 2023-01-23 DIAGNOSIS — I700 Atherosclerosis of aorta: Secondary | ICD-10-CM | POA: Diagnosis not present

## 2023-01-23 DIAGNOSIS — N1831 Chronic kidney disease, stage 3a: Secondary | ICD-10-CM | POA: Diagnosis not present

## 2023-01-23 DIAGNOSIS — T466X5A Adverse effect of antihyperlipidemic and antiarteriosclerotic drugs, initial encounter: Secondary | ICD-10-CM

## 2023-01-23 DIAGNOSIS — G720 Drug-induced myopathy: Secondary | ICD-10-CM | POA: Diagnosis not present

## 2023-01-23 MED ORDER — PRAVASTATIN SODIUM 40 MG PO TABS: 40.0000 mg | ORAL_TABLET | Freq: Every evening | ORAL | 3 refills | Status: DC

## 2023-01-23 MED ORDER — HYDROCHLOROTHIAZIDE 12.5 MG PO CAPS: 12.5000 mg | ORAL_CAPSULE | Freq: Every day | ORAL | 3 refills | Status: DC

## 2023-01-23 NOTE — Patient Instructions (Signed)
Medication Instructions:  Your physician has recommended you make the following change in your medication:  START: hydrochlorothiazide (HCTZ) 12.5 mg by mouth once daily  TAKE: pravastatin 40 mg by mouth once daily  *If you need a refill on your cardiac medications before your next appointment, please call your pharmacy*   Lab Work: IN 1-2 WEEKS: BMP  If you have labs (blood work) drawn today and your tests are completely normal, you will receive your results only by: [place] (if you have MyChart) OR A paper copy in the mail If you have any lab test that is abnormal or we need to change your treatment, we will call you to review the results.   Testing/Procedures: NONE   Follow-Up: At [facility], you and your health needs are our priority.  As part of our continuing mission to provide you with exceptional heart care, we have created designated Provider Care Teams.  These Care Teams include your primary Cardiologist (physician) and Advanced Practice Providers (APPs -  Physician Assistants and Nurse Practitioners) who all work together to provide you with the care you need, when you need it.  We recommend signing up for the patient portal called "MyChart".  Sign up information is provided on this After Visit Summary.  MyChart is used to connect with patients for Virtual Visits (Telemedicine).  Patients are able to view lab/test results, encounter notes, upcoming appointments, etc.  Non-urgent messages can be sent to your provider as well.   To learn more about what you can do with MyChart, go to [email or web address].    Your next appointment:   6 month(s)  Provider:   Werner Lean, MD

## 2023-01-24 ENCOUNTER — Telehealth: Payer: Self-pay | Admitting: Internal Medicine

## 2023-01-24 DIAGNOSIS — I10 Essential (primary) hypertension: Secondary | ICD-10-CM

## 2023-01-24 DIAGNOSIS — G720 Drug-induced myopathy: Secondary | ICD-10-CM

## 2023-01-24 DIAGNOSIS — E782 Mixed hyperlipidemia: Secondary | ICD-10-CM

## 2023-01-24 NOTE — Telephone Encounter (Signed)
Pt c/o medication issue:  1. Name of Medication: hydrochlorothiazide (MICROZIDE) 12.5 MG capsule   2. How are you currently taking this medication (dosage and times per day)?    3. Are you having a reaction (difficulty breathing--STAT)? No   4. What is your medication issue? Patient called to say that she can take this medication because of her kidney and muscle problem. Please advise

## 2023-01-24 NOTE — Telephone Encounter (Signed)
Returned call to patient.  Patient states she took HCTZ in the past and her PCP took her off of it because it caused her to lose weight and have muscle pain. She has not picked up the prescription that was sent in yesterday.  Patient would like to know if there is a different medication she could try.  Will forward to Dr. Oralia Rud nurse to address.

## 2023-01-25 MED ORDER — HYDRALAZINE HCL 25 MG PO TABS: 25.0000 mg | ORAL_TABLET | Freq: Two times a day (BID) | ORAL | 3 refills | Status: DC

## 2023-01-25 NOTE — Telephone Encounter (Signed)
Pt advised to stop HCTZ and start Hydralazine per Dr. Gasper Sells. . Aware office will call to arrange pharmD appt. Patient verbalized understanding and agreeable to plan.

## 2023-01-29 ENCOUNTER — Encounter: Payer: Self-pay | Admitting: Family Medicine

## 2023-02-06 ENCOUNTER — Other Ambulatory Visit: Payer: Medicare HMO

## 2023-02-20 DIAGNOSIS — Z0100 Encounter for examination of eyes and vision without abnormal findings: Secondary | ICD-10-CM | POA: Diagnosis not present

## 2023-04-22 ENCOUNTER — Other Ambulatory Visit: Payer: Self-pay | Admitting: Internal Medicine

## 2023-06-01 IMAGING — US US RENAL
1 series · 14 of 25 positions shown · non-contrast
Comparison: CT dated 01/10/2014.

CLINICAL DATA: 69-year-old female with acute renal insufficiency.
An intentional weight loss.

EXAM:
RENAL / URINARY TRACT ULTRASOUND COMPLETE

[Series 1: us renal · 0.23mm/px · 14 of 49 slices shown]
[im 1/49]
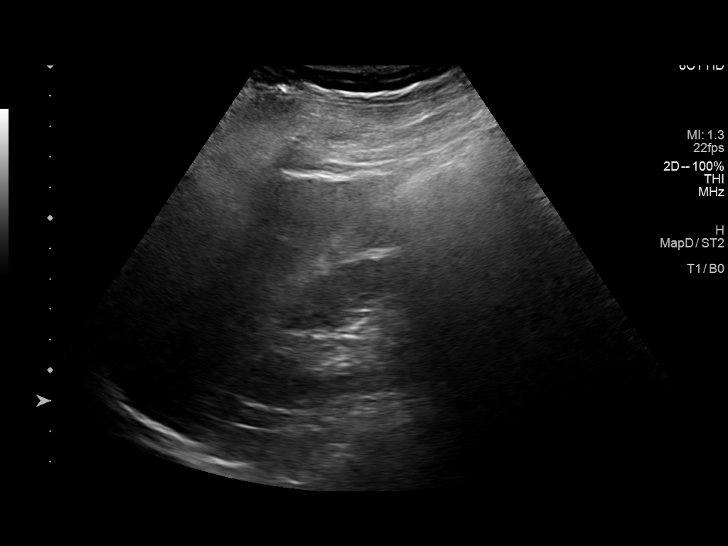
[im 5/49]
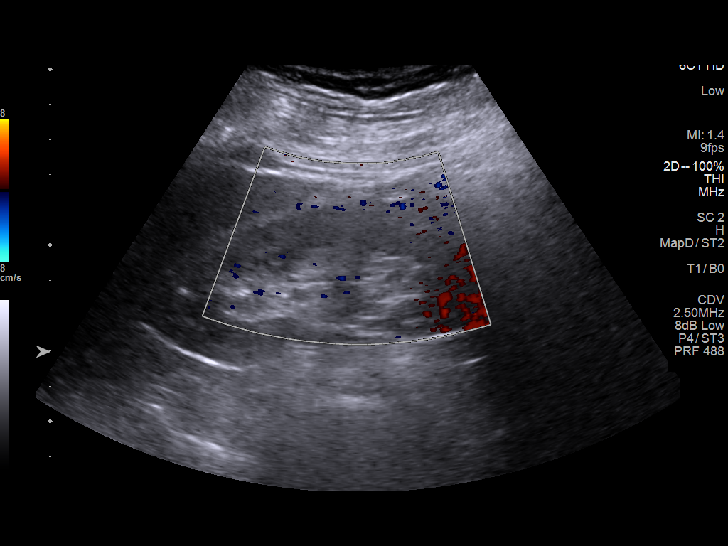
[im 9/49]
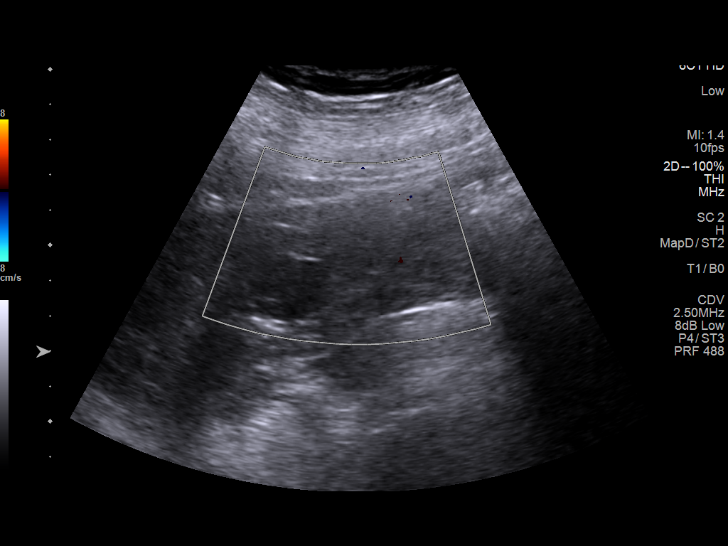
[im 13/49]
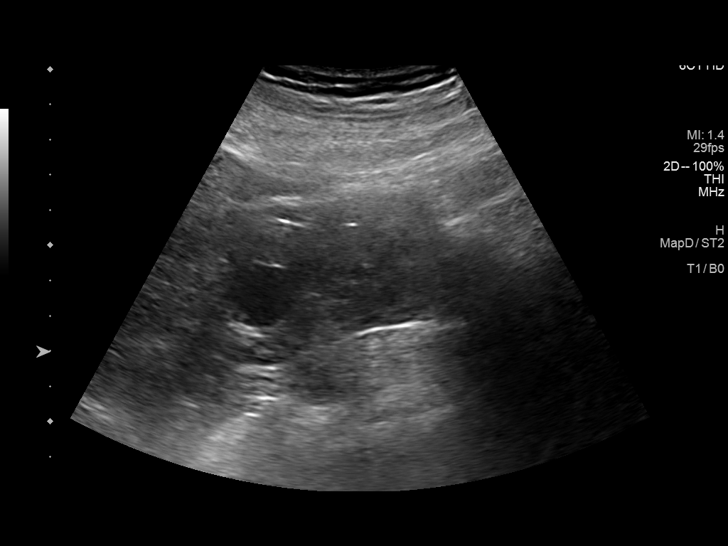
[im 17/49]
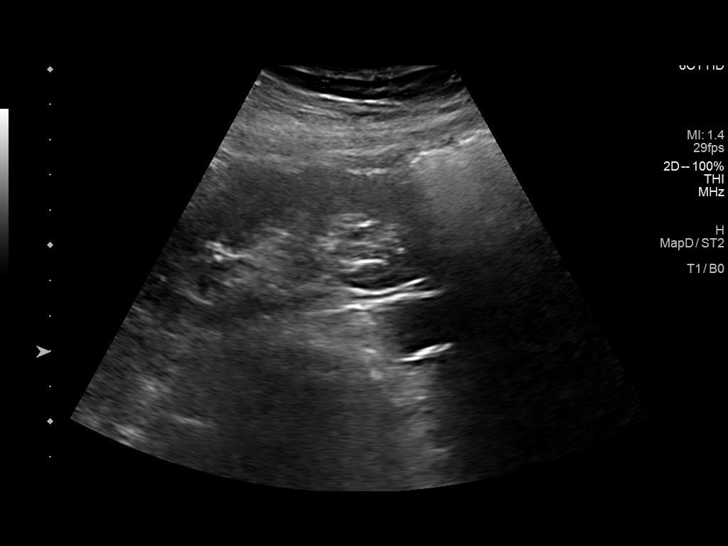
[im 19/49]
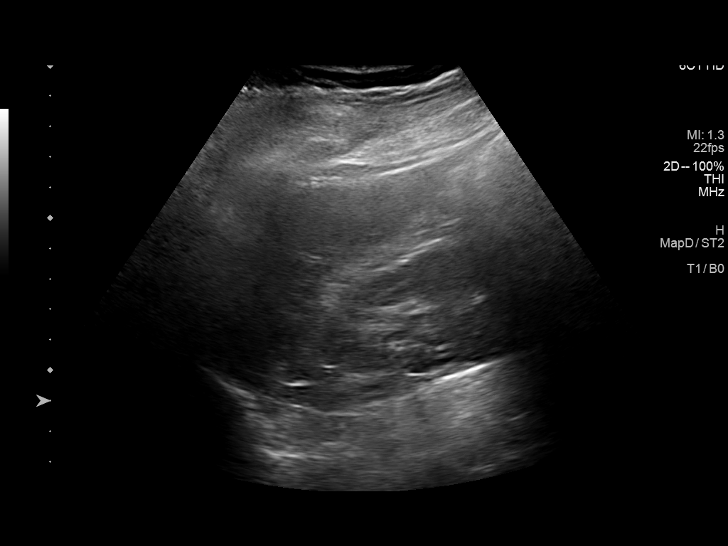
[im 23/49]
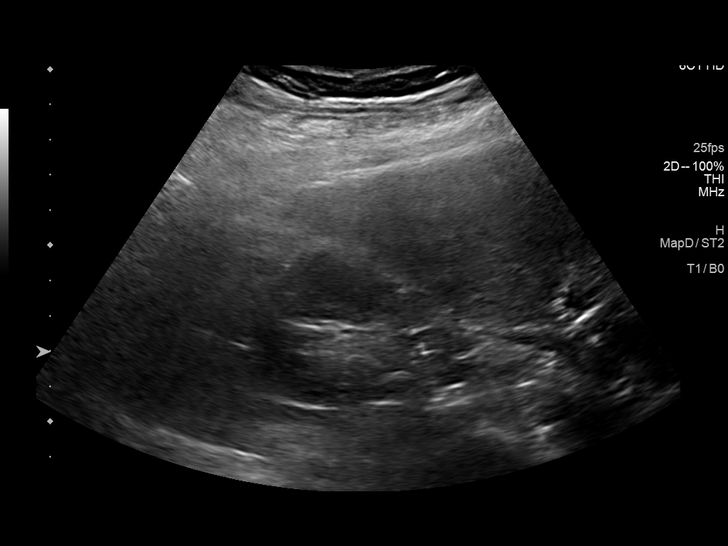
[im 27/49]
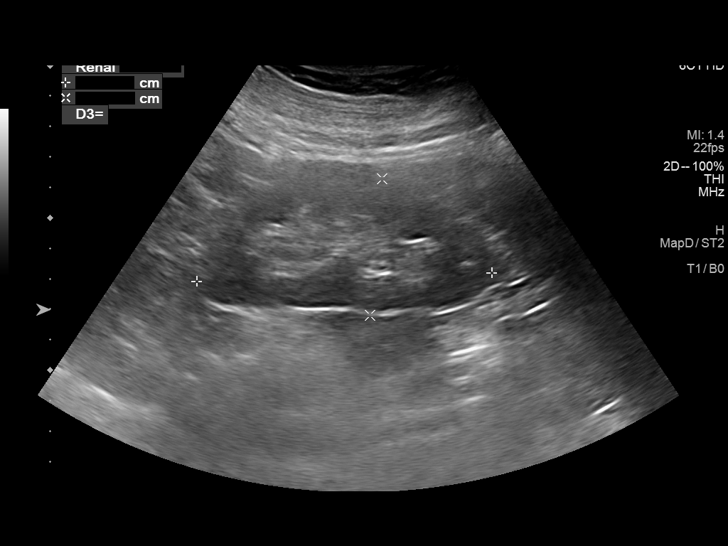
[im 31/49]
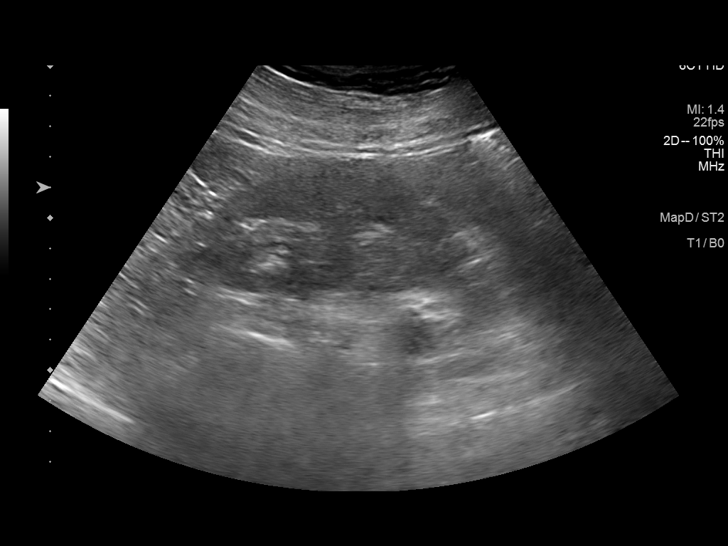
[im 33/49]
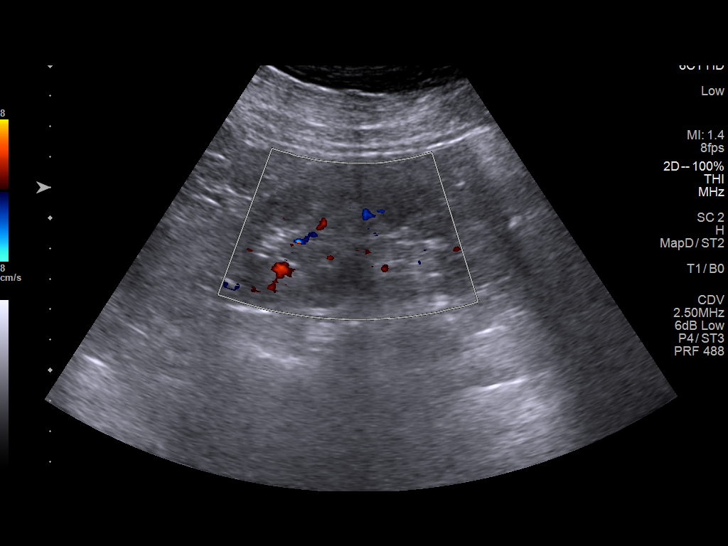
[im 37/49]
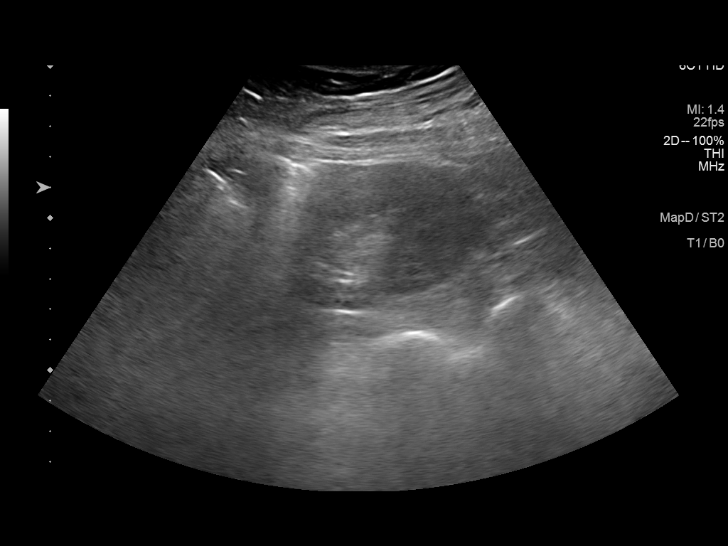
[im 41/49]
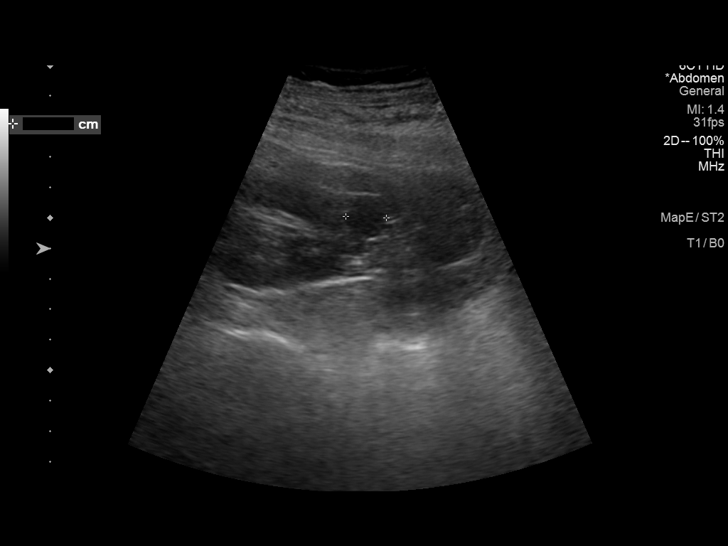
[im 45/49]
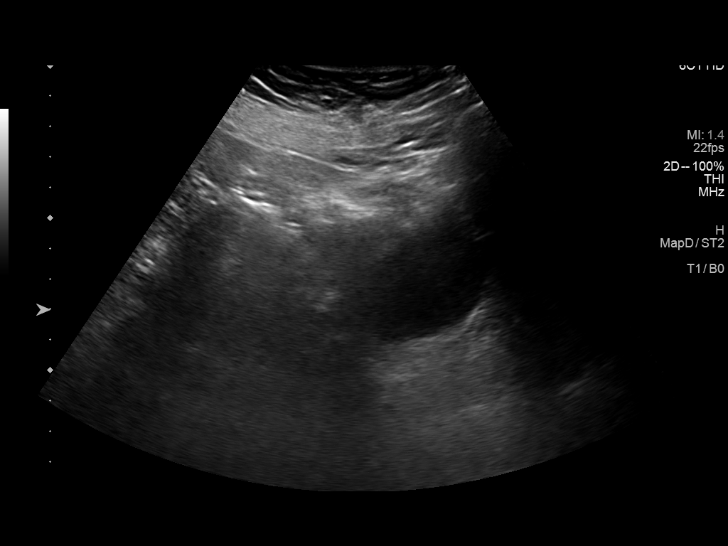
[im 49/49]
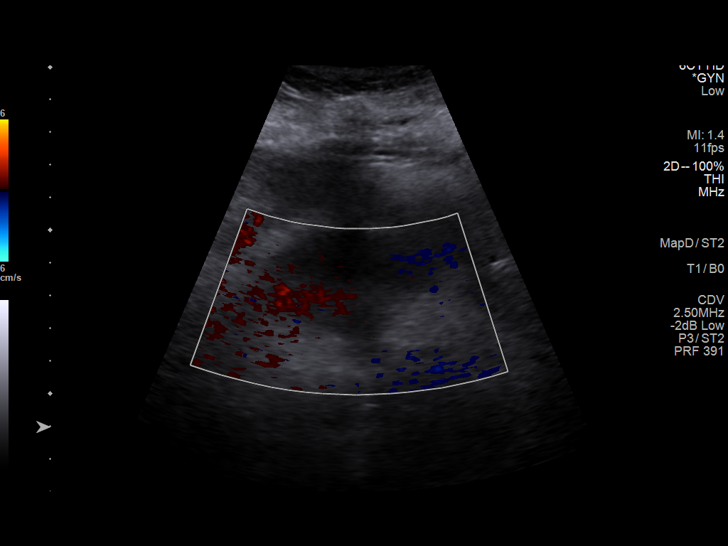

[14 of 25 positions shown; findings below may reference images not displayed]

FINDINGS: Evaluation is limited due to body habitus and overlying bowel gas.

Right Kidney:

Renal measurements: 9.6 x 4.0 x 4.0 cm = volume: 80 mL. Normal
echogenicity. No hydronephrosis or shadowing stone. There is a 2 cm
upper pole cyst.

Left Kidney:

Renal measurements: 9.7 x 4.5 x 4.7 cm = volume: 108 mL. Normal
echogenicity. No hydronephrosis or shadowing stone. There is a 15 mm
upper pole cyst.

Bladder:

Appears normal for degree of bladder distention.

Other:

None.
IMPRESSION: Small bilateral renal cysts.  No hydronephrosis or shadowing stone.

## 2023-06-17 ENCOUNTER — Ambulatory Visit (INDEPENDENT_AMBULATORY_CARE_PROVIDER_SITE_OTHER): Payer: Medicare HMO | Admitting: Family Medicine

## 2023-06-17 ENCOUNTER — Encounter: Payer: Self-pay | Admitting: Family Medicine

## 2023-06-17 VITALS — BP 138/80 | HR 55 | Wt 175.8 lb

## 2023-06-17 DIAGNOSIS — H9313 Tinnitus, bilateral: Secondary | ICD-10-CM | POA: Diagnosis not present

## 2023-06-17 NOTE — Progress Notes (Unsigned)
    SUBJECTIVE:   CHIEF COMPLAINT / HPI:   Ear Complaints Patient reports episodic buzzing sound in her ears. Bilateral. Started about 1 month ago. Occurs maybe twice per week. Usually notices it when she lays down at night Lasts a few minutes and then resolves Thought maybe she had wax buildup, tried using a q-tip with no relief Denies any decrease in hearing Denies ear pain Denies headache, dizziness, jaw pain, recent illness, or any other complaints.  PERTINENT  PMH / PSH: HTN, HLD, anemia  OBJECTIVE:   BP 138/80 (BP Location: Right Arm, Patient Position: Sitting, Cuff Size: Large)   Pulse (!) 55   Wt 175 lb 12.8 oz (79.7 kg)   SpO2 100%   BMI 32.15 kg/m   Gen: alert, well-appearing, NAD Head: [place]/AT, no TMJ tenderness, some TMJ crepitus noted Eyes: normal sclera and conjunctiva, PERRL Ears: external ears, canals, and TMs normal bilaterally Nose: nares patent, no appreciable turbinate hypertrophy Throat: oropharynx unremarkable without tonsillar edema, erythema or exudate Neck: no cervical or supraclavicular lymphadenopathy CV: RRR, normal S1/S2 without m/r/g Resp: normal effort, lungs CTAB  Skin: no rashes on exposed skin Neuro: grossly intact   ASSESSMENT/PLAN:   Tinnitus of both ears Episodic tinnitus which is fairly brief and self-resolving. Etiology unclear at this point but no red flags on history or exam. Possible contribution from TMJ. If persistent, more frequent, or new symptoms develop would obtain further workup including labs to rule out anemia, audiologic evaluation, and consideration of vascular studies.     Maury Dus, MD [facility]

## 2023-06-17 NOTE — Patient Instructions (Addendum)
It was great to meet you!  Your ear exam was normal today. If your symptoms become more frequent, persistent, or bothersome please let us know.  If you develop any new symptoms such as headache, dizziness, vision changes, vomiting, or any other concerns please let us know right away.  Try not to use Q-tips or anything else inside the ear. If you feel you have a significant wax buildup, you can use Debrox drops (available over-the-counter). You can also apply a drop of olive oil or baby shampoo once or twice weekly to help prevent wax buildup.  Schedule an appointment for an annual physical on your way out today.  -Dr Anner Crete

## 2023-06-18 DIAGNOSIS — H9313 Tinnitus, bilateral: Secondary | ICD-10-CM | POA: Insufficient documentation

## 2023-06-18 NOTE — Assessment & Plan Note (Signed)
Episodic tinnitus which is fairly brief and self-resolving. Etiology unclear at this point but no red flags on history or exam. Possible contribution from TMJ. If persistent, more frequent, or new symptoms develop would obtain further workup including labs to rule out anemia, audiologic evaluation, and consideration of vascular studies.

## 2023-07-23 ENCOUNTER — Encounter: Payer: Medicare HMO | Admitting: Family Medicine

## 2023-07-23 NOTE — Progress Notes (Deleted)
Cardiology Office Note:    Date:  07/23/2023   ID:  [name], DOB 05/04/1951, MRN [id number]  PCP:  Caro Laroche, DO   [facility] Providers Cardiologist:  Christell Constant, MD     Referring MD: Moses Manners, MD   CC: follow up HTN  History of Present Illness:    [name] is a 72 y.o. female with a hx of HTN, PVCs.  And PACs.  HLD with statin myopathy. Last seen 2021 for HTN emergency.  Has been lost to f/u since. 2024: Did not want to try hydrochlorothiazide due to muscle pain in the past.  Started on hydralazine in stead.  Planned for Pharm D visit.  Patient notes that she is doing ***.   Since last visit notes *** . There are no*** interval hospital/ED visit.   EKG showed ***  No chest pain or pressure ***.  No SOB/DOE*** and no PND/Orthopnea***.  No weight gain or leg swelling***.  No palpitations or syncope ***.  Ambulatory blood pressure ***.    Past Medical History:  Diagnosis Date   Hypertension     Past Surgical History:  Procedure Laterality Date   ABDOMINAL HYSTERECTOMY      Current Medications: No outpatient medications have been marked as taking for the 07/24/23 encounter (Appointment) with Christell Constant, MD.     Allergies:   Ace inhibitors, Amlodipine, and Lipitor [atorvastatin]   Social History   Socioeconomic History   Marital status: Divorced    Spouse name: Not on file   Number of children: Not on file   Years of education: Not on file   Highest education level: Not on file  Occupational History   Not on file  Tobacco Use   Smoking status: Former    Current packs/day: 0.00    Types: Cigarettes    Start date: 08/16/1984    Quit date: 08/16/1989    Years since quitting: 33.9   Smokeless tobacco: Never  Vaping Use   Vaping status: Never Used  Substance and Sexual Activity   Alcohol use: No   Drug use: No   Sexual activity: Never  Other Topics Concern   Not on file  Social History  Narrative   Not on file   Social Determinants of Health   Financial Resource Strain: Not on file  Food Insecurity: Not on file  Transportation Needs: Not on file  Physical Activity: Not on file  Stress: Not on file  Social Connections: Not on file     Family History: Family history of MI sister and mother (both deceased) Father with prior MI.  ROS:   Please see the history of present illness.     EKGs/Labs/Other Studies Reviewed:    The following studies were reviewed today:  EKG:   01/23/23: SR with inferior TWI  Cardiac Studies & Procedures       ECHOCARDIOGRAM  ECHOCARDIOGRAM COMPLETE 09/25/2017  Narrative *[facility]* [address] [place], [place] [address] [phone]  ------------------------------------------------------------------- Transthoracic Echocardiography  Patient:    Laurie, [name] MR #:       [id number] Study Date: 09/25/2017 Gender:     F Age:        65 Height:     154.9 cm Weight:     79.6 kg BSA:        1.89 m^2 Pt. Status: Room:  Hilma Favors, MD REFERRING    Lyn Records, MD  REFERRING    [facility] Manners ATTENDING    Donato Schultz, M.D. SONOGRAPHER  Clearence Ped, RCS PERFORMING   [facility], Outpatient  cc:  ------------------------------------------------------------------- LV EF: 55% -   60%  ------------------------------------------------------------------- Indications:      Cardiomegaly (I51.7).  ------------------------------------------------------------------- History:   Risk factors:  Hypertension.  ------------------------------------------------------------------- Study Conclusions  - Left ventricle: The cavity size was normal. Wall thickness was normal. Systolic function was normal. The estimated ejection fraction was in the range of 55% to 60%. Wall motion was normal; there were no regional wall motion abnormalities. Doppler parameters are consistent with abnormal left  ventricular relaxation (grade 1 diastolic dysfunction). - Mitral valve: Calcified annulus. Valve area by pressure half-time: 1.76 cm^2. - Left atrium: The atrium was mildly dilated. Volume/bsa, ES, (1-plane Simpson&'s, A2C): 41 ml/m^2.  ------------------------------------------------------------------- Study data:  Comparison was made to the study of 01/11/2014.  Study status:  Routine.  Procedure:  The patient reported no pain pre or post test. Transthoracic echocardiography. Image quality was adequate.          Transthoracic echocardiography.  M-mode, complete 2D, spectral Doppler, and color Doppler.  Birthdate: Patient birthdate: 05-13-1951.  Age:  Patient is 72 yr old.  Sex: Gender: female.    BMI: 33.2 kg/m^2.  Blood pressure:     170/94 Patient status:  Outpatient.  Study date:  Study date: 09/25/2017. Study time: 10:09 AM.  Location:  [facility]  -------------------------------------------------------------------  ------------------------------------------------------------------- Left ventricle:  The cavity size was normal. Wall thickness was normal. Systolic function was normal. The estimated ejection fraction was in the range of 55% to 60%. Wall motion was normal; there were no regional wall motion abnormalities. Doppler parameters are consistent with abnormal left ventricular relaxation (grade 1 diastolic dysfunction).  ------------------------------------------------------------------- Aortic valve:   Trileaflet; normal thickness leaflets. Mobility was not restricted.  Doppler:  Transvalvular velocity was within the normal range. There was no stenosis. There was no regurgitation.  ------------------------------------------------------------------- Aorta:  Aortic root: The aortic root was normal in size.  ------------------------------------------------------------------- Mitral valve:   Calcified annulus. Mobility was not restricted. Doppler:  Transvalvular  velocity was within the normal range. There was no evidence for stenosis. There was trivial regurgitation. Valve area by pressure half-time: 1.76 cm^2. Indexed valve area by pressure half-time: 0.93 cm^2/m^2.  ------------------------------------------------------------------- Left atrium:  The atrium was mildly dilated.  ------------------------------------------------------------------- Right ventricle:  The cavity size was normal. Wall thickness was normal. Systolic function was normal.  ------------------------------------------------------------------- Pulmonic valve:    Doppler:  Transvalvular velocity was within the normal range. There was no evidence for stenosis.  ------------------------------------------------------------------- Tricuspid valve:   Structurally normal valve.    Doppler: Transvalvular velocity was within the normal range. There was no regurgitation.  ------------------------------------------------------------------- Pulmonary artery:   The main pulmonary artery was normal-sized. Systolic pressure was within the normal range.  ------------------------------------------------------------------- Right atrium:  The atrium was normal in size.  ------------------------------------------------------------------- Pericardium:  There was no pericardial effusion.  ------------------------------------------------------------------- Systemic veins: Inferior vena cava: The vessel was normal in size. The respirophasic diameter changes were in the normal range (= 50%), consistent with normal central venous pressure. Diameter: 12 mm.  ------------------------------------------------------------------- Measurements  IVC                                      Value          Reference ID  12    mm       ----------  Left ventricle                           Value          Reference LV ID, ED, PLAX chordal                  51    mm        43 - 52 LV ID, ES, PLAX chordal                  38    mm       23 - 38 LV fx shortening, PLAX chordal (L)       25    %        >=29 LV PW thickness, ED                      11    mm       ---------- IVS/LV PW ratio, ED                      0.91           <=1.3 Stroke volume, 2D                        62    ml       ---------- Stroke volume/bsa, 2D                    33    ml/m^2   ---------- LV e&', lateral                           5     cm/s     ---------- LV E/e&', lateral                         12.42          ---------- LV e&', medial                            4.03  cm/s     ---------- LV E/e&', medial                          15.41          ---------- LV e&', average                           4.52  cm/s     ---------- LV E/e&', average                         13.75          ----------  Ventricular septum                       Value          Reference IVS thickness, ED                        10    mm       ----------  LVOT  Value          Reference LVOT ID, S                               20    mm       ---------- LVOT area                                3.14  cm^2     ---------- LVOT peak velocity, S                    83.5  cm/s     ---------- LVOT mean velocity, S                    54.1  cm/s     ---------- LVOT VTI, S                              19.7  cm       ----------  Aorta                                    Value          Reference Aortic root ID, ED                       33    mm       ----------  Left atrium                              Value          Reference LA ID, A-P, ES                           41    mm       ---------- LA ID/bsa, A-P                           2.18  cm/m^2   <=2.2 LA volume, S                             66    ml       ---------- LA volume/bsa, S                         35    ml/m^2   ---------- LA volume, ES, 1-p A4C                   51.5  ml       ---------- LA volume/bsa, ES, 1-p A4C                27.3  ml/m^2   ---------- LA volume, ES, 1-p A2C                   77.3  ml       ---------- LA volume/bsa, ES, 1-p A2C               41  ml/m^2   ----------  Mitral valve                             Value          Reference Mitral E-wave peak velocity              62.1  cm/s     ---------- Mitral A-wave peak velocity              83.4  cm/s     ---------- Mitral deceleration time       (H)       426   ms       150 - 230 Mitral pressure half-time                125   ms       ---------- Mitral E/A ratio, peak                   0.7            ---------- Mitral valve area, PHT, DP               1.76  cm^2     ---------- Mitral valve area/bsa, PHT, DP           0.93  cm^2/m^2 ----------  Right atrium                             Value          Reference RA ID, S-I, ES, A4C                      46.4  mm       34 - 49 RA area, ES, A4C                         15    cm^2     8.3 - 19.5 RA volume, ES, A/L                       40.9  ml       ---------- RA volume/bsa, ES, A/L                   21.7  ml/m^2   ----------  Right ventricle                          Value          Reference TAPSE                                    22.6  mm       ---------- RV s&', lateral, S                        9.46  cm/s     ----------  Legend: (L)  and  (H)  mark values outside specified reference range.  ------------------------------------------------------------------- Prepared and Electronically Authenticated by  Donato Schultz, M.D. 2018-10-03T15:14:03              Recent Labs: 07/25/2022: BUN 27; Creatinine, Ser 1.51; Hemoglobin 12.4; Platelets 211; Potassium 4.1; Sodium 137  Recent Lipid Panel  Component Value Date/Time   CHOL 168 05/17/2021 1448   TRIG 196 (H) 05/17/2021 1448   HDL 21 (L) 05/17/2021 1448   CHOLHDL 8.0 (H) 05/17/2021 1448   CHOLHDL 4.3 01/11/2014 0250   VLDL 11 01/11/2014 0250   LDLCALC 112 (H) 05/17/2021 1448   LDLDIRECT 140 (H) 08/17/2011 0949     Risk  Assessment/Calculations:     No BP recorded.  {Refresh Note OR Click here to enter BP  :1}***      Physical Exam:    VS:  There were no vitals taken for this visit.    Wt Readings from Last 3 Encounters:  06/17/23 175 lb 12.8 oz (79.7 kg)  01/23/23 172 lb (78 kg)  07/25/22 180 lb (81.6 kg)    GEN:  Well nourished, well developed in no acute distress HEENT: Normal NECK: No JVD CARDIAC: RRR, no murmurs, rubs, gallops RESPIRATORY:  Clear to auscultation without rales, wheezing or rhonchi  ABDOMEN: Soft, non-tender, non-distended MUSCULOSKELETAL:  No edema; No deformity  SKIN: Warm and dry NEUROLOGIC:  Alert and oriented x 3 PSYCHIATRIC:  Normal affect   ASSESSMENT:    No diagnosis found.  PLAN:    HTN with [place] HTN CKD Stage IIIa - with amlodipine lip swelling - with hydrochlorothiazide myalgia - with ACEi inhibitor AKI - discussed dietary changes - continue AMB BP monitoring - will start HCTZ 12.5 mg PO Daily and BMP in 1-2 weeks  HLD with statin myopathy (atorvastatin muscle aches) Aortic atherosclerosis - will restart pravastatin 40 mg and check labs in three months  I do not have a strong indication ASA, will continue discussion for DC in subsequent visits      Medication Adjustments/Labs and Tests Ordered: Current medicines are reviewed at length with the patient today.  Concerns regarding medicines are outlined above.  No orders of the defined types were placed in this encounter.  No orders of the defined types were placed in this encounter.   There are no Patient Instructions on file for this visit.   Signed, Christell Constant, MD  07/23/2023 4:03 PM    [facility]

## 2023-07-24 ENCOUNTER — Ambulatory Visit: Payer: Medicare HMO | Attending: Internal Medicine | Admitting: Internal Medicine

## 2023-07-24 ENCOUNTER — Encounter: Payer: Self-pay | Admitting: Internal Medicine

## 2023-08-18 NOTE — Patient Instructions (Incomplete)
Ms. Ribble , Thank you for taking time to come for your Medicare Wellness Visit. I appreciate your ongoing commitment to your health goals. Please review the following plan we discussed and let me know if I can assist you in the future.   Referrals/Orders/Follow-Ups/Clinician Recommendations: Aim for 30 minutes of exercise or brisk walking, 6-8 glasses of water, and 5 servings of fruits and vegetables each day.  You have an order for:  []   2D Mammogram  [x]   3D Mammogram  [x]   Bone Density     Please call for appointment:   [facility] [address] [place], [place] [address] [phone]  Make sure to wear two-piece clothing.  No lotions, powders, or deodorants the day of the appointment. Make sure to bring picture ID and insurance card.  Bring list of medications you are currently taking including any supplements.   Schedule your [place] screening mammogram through MyChart!   Log into your MyChart account.  Go to 'Visit' (or 'Appointments' if on mobile App) --> Schedule an Appointment  Under 'Select a Reason for Visit' choose the Mammogram Screening option.  Complete the pre-visit questions and select the time and place that best fits your schedule.    This is a list of the screening recommended for you and due dates:  Health Maintenance  Topic Date Due   DEXA scan (bone density measurement)  Never done   Pneumonia Vaccine (2 of 2 - PPSV23 or PCV20) 11/05/2020   Zoster (Shingles) Vaccine (2 of 2) 06/22/2021   DTaP/Tdap/Td vaccine (3 - Td or Tdap) 08/16/2021   COVID-19 Vaccine (5 - 2023-24 season) 08/24/2022   Flu Shot  07/25/2023   Medicare Annual Wellness Visit  08/18/2024   Mammogram  10/15/2024   Colon Cancer Screening  11/15/2026   Hepatitis C Screening  Completed   HPV Vaccine  Aged Out    Advanced directives: (ACP Link)Information on Advanced Care Planning can be found at [facility] Health Care  Directives Advance Health Care Directives ([email or web address]   Next Medicare Annual Wellness Visit scheduled for next year: Yes

## 2023-08-18 NOTE — Progress Notes (Signed)
Subjective:   [name] is a 72 y.o. female who presents for an Initial Medicare Annual Wellness Visit.  Visit Complete: Virtual  I connected with  [name] on 08/19/23 by a audio enabled telemedicine application and verified that I am speaking with the correct person using two identifiers.  Patient Location: Home  Provider Location: Home Office  I discussed the limitations of evaluation and management by telemedicine. The patient expressed understanding and agreed to proceed.  Vital Signs: Because this visit was a virtual/telehealth visit, some criteria may be missing or patient reported. Any vitals not documented were not able to be obtained and vitals that have been documented are patient reported.   Review of Systems     Cardiac Risk Factors include: advanced age (>48men, >27 women);hypertension;dyslipidemia     Objective:    Today's Vitals   08/19/23 1057  Weight: 175 lb (79.4 kg)  Height: 5\' 2"  (1.575 m)   Body mass index is 32.01 kg/m.     08/19/2023   11:34 AM 07/25/2022    3:18 PM 06/19/2021    9:43 AM 06/15/2021    3:09 AM 05/17/2021    1:24 PM 01/22/2018   10:21 AM 09/27/2017    6:36 AM  Advanced Directives  Does Patient Have a Medical Advance Directive? No No No No No No No  Would patient like information on creating a medical advance directive? Yes (MAU/Ambulatory/Procedural Areas - Information given)  No - Patient declined No - Patient declined No - Patient declined No - Patient declined     Current Medications (verified) Outpatient Encounter Medications as of 08/19/2023  Medication Sig   aspirin 81 MG EC tablet TAKE 1 TABLET (81 MG TOTAL) BY MOUTH DAILY.   EPINEPHrine 0.3 mg/0.3 mL IJ SOAJ injection Inject 0.3 mg into the muscle as needed for anaphylaxis.   hydrALAZINE (APRESOLINE) 25 MG tablet TAKE 1 TABLET BY MOUTH TWICE A DAY   ondansetron (ZOFRAN-ODT) 4 MG disintegrating tablet Take 1 tablet (4 mg total) by mouth every 8 (eight) hours as  needed for nausea or vomiting.   pravastatin (PRAVACHOL) 40 MG tablet Take 1 tablet (40 mg total) by mouth every evening.   spironolactone (ALDACTONE) 25 MG tablet TAKE 1 TABLET BY MOUTH EVERYDAY AT BEDTIME   No facility-administered encounter medications on file as of 08/19/2023.    Allergies (verified) Ace inhibitors, Amlodipine, and Lipitor [atorvastatin]   History: Past Medical History:  Diagnosis Date   Hypertension    Past Surgical History:  Procedure Laterality Date   ABDOMINAL HYSTERECTOMY     History reviewed. No pertinent family history. Social History   Socioeconomic History   Marital status: Divorced    Spouse name: Not on file   Number of children: Not on file   Years of education: Not on file   Highest education level: Not on file  Occupational History   Not on file  Tobacco Use   Smoking status: Former    Current packs/day: 0.00    Types: Cigarettes    Start date: 08/16/1984    Quit date: 08/16/1989    Years since quitting: 34.0   Smokeless tobacco: Never  Vaping Use   Vaping status: Never Used  Substance and Sexual Activity   Alcohol use: No   Drug use: No   Sexual activity: Never  Other Topics Concern   Not on file  Social History Narrative   Not on file   Social Determinants of Health   Financial Resource Strain:  Low Risk  (08/19/2023)   Overall Financial Resource Strain (CARDIA)    Difficulty of Paying Living Expenses: Not hard at all  Food Insecurity: No Food Insecurity (08/19/2023)   Hunger Vital Sign    Worried About Running Out of Food in the Last Year: Never true    Ran Out of Food in the Last Year: Never true  Transportation Needs: No Transportation Needs (08/19/2023)   PRAPARE - Administrator, Civil Service (Medical): No    Lack of Transportation (Non-Medical): No  Physical Activity: Insufficiently Active (08/19/2023)   Exercise Vital Sign    Days of Exercise per Week: 3 days    Minutes of Exercise per Session: 30 min   Stress: No Stress Concern Present (08/19/2023)   [employer] of Occupational Health - Occupational Stress Questionnaire    Feeling of Stress : Not at all  Social Connections: Moderately Isolated (08/19/2023)   Social Connection and Isolation Panel [NHANES]    Frequency of Communication with Friends and Family: More than three times a week    Frequency of Social Gatherings with Friends and Family: Three times a week    Attends Religious Services: 1 to 4 times per year    Active Member of Clubs or Organizations: No    Attends Banker Meetings: Never    Marital Status: Divorced    Tobacco Counseling Counseling given: Not Answered   Clinical Intake:  Pre-visit preparation completed: Yes  Pain : No/denies pain     Diabetes: No  How often do you need to have someone help you when you read instructions, pamphlets, or other written materials from your doctor or pharmacy?: 1 - Never  Interpreter Needed?: No  Information entered by :: Kandis Fantasia LPN   Activities of Daily Living    08/19/2023   11:34 AM  In your present state of health, do you have any difficulty performing the following activities:  Hearing? 0  Vision? 0  Difficulty concentrating or making decisions? 0  Walking or climbing stairs? 0  Dressing or bathing? 0  Doing errands, shopping? 0  Preparing Food and eating ? N  Using the Toilet? N  In the past six months, have you accidently leaked urine? N  Do you have problems with loss of bowel control? N  Managing your Medications? N  Managing your Finances? N  Housekeeping or managing your Housekeeping? N    Patient Care Team: Caro Laroche, DO as PCP - General (Family Medicine) Christell Constant, MD as PCP - Cardiology (Cardiology)  Indicate any recent Medical Services you may have received from other than [facility] providers in the past year (date may be approximate).     Assessment:   This is a routine wellness examination for  Aribelle.  Hearing/Vision screen Hearing Screening - Comments:: Denies hearing difficulties   Vision Screening - Comments:: Wears rx glasses - up to date with routine eye exams    Dietary issues and exercise activities discussed:     Goals Addressed             This Visit's Progress    Remain active and independent         Depression Screen    08/19/2023   11:32 AM 06/17/2023    9:41 AM 06/19/2021    9:43 AM 05/25/2021   10:25 AM 05/17/2021    1:26 PM 05/07/2018    1:36 PM 07/10/2017    1:39 PM  PHQ 2/9 Scores  PHQ -  2 Score 0 0 0 0 0 0 0  PHQ- 9 Score 0  0 1 2      Fall Risk    08/19/2023   11:33 AM 06/17/2023    9:41 AM 05/17/2021    1:27 PM 05/07/2018    1:36 PM 07/10/2017    1:39 PM  Fall Risk   Falls in the past year? 0 0 0 No No  Number falls in past yr: 0 0     Injury with Fall? 0 0     Risk for fall due to : No Fall Risks No Fall Risks     Follow up Falls prevention discussed;Education provided;Falls evaluation completed        MEDICARE RISK AT HOME: Medicare Risk at Home Any stairs in or around the home?: No If so, are there any without handrails?: No Home free of loose throw rugs in walkways, pet beds, electrical cords, etc?: Yes Adequate lighting in your home to reduce risk of falls?: Yes Life alert?: No Use of a cane, walker or w/c?: No Grab bars in the bathroom?: Yes Shower chair or bench in shower?: No Elevated toilet seat or a handicapped toilet?: Yes  TIMED UP AND GO:  Was the test performed? No    Cognitive Function:        08/19/2023   11:34 AM  6CIT Screen  What Year? 0 points  What month? 0 points  What time? 0 points  Count back from 20 0 points  Months in reverse 0 points  Repeat phrase 0 points  Total Score 0 points    Immunizations Immunization History  Administered Date(s) Administered   Influenza,inj,Quad PF,6+ Mos 01/11/2014   Moderna Sars-Covid-2 Vaccination 02/14/2020, 03/04/2020, 11/08/2020   PFIZER Comirnaty(Gray  Top)Covid-19 Tri-Sucrose Vaccine 06/19/2021   Pneumococcal Conjugate-13 07/10/2017, 11/06/2019   Td 04/23/1998   Tdap 08/17/2011   Zoster Recombinant(Shingrix) 04/27/2021    TDAP status: Due, Education has been provided regarding the importance of this vaccine. Advised may receive this vaccine at local pharmacy or Health Dept. Aware to provide a copy of the vaccination record if obtained from local pharmacy or Health Dept. Verbalized acceptance and understanding.  Flu Vaccine status: Due, Education has been provided regarding the importance of this vaccine. Advised may receive this vaccine at local pharmacy or Health Dept. Aware to provide a copy of the vaccination record if obtained from local pharmacy or Health Dept. Verbalized acceptance and understanding.  Pneumococcal vaccine status: Due, Education has been provided regarding the importance of this vaccine. Advised may receive this vaccine at local pharmacy or Health Dept. Aware to provide a copy of the vaccination record if obtained from local pharmacy or Health Dept. Verbalized acceptance and understanding.  Covid-19 vaccine status: Information provided on how to obtain vaccines.   Qualifies for Shingles Vaccine? Yes   Zostavax completed No   Shingrix Completed?: No.    Education has been provided regarding the importance of this vaccine. Patient has been advised to call insurance company to determine out of pocket expense if they have not yet received this vaccine. Advised may also receive vaccine at local pharmacy or Health Dept. Verbalized acceptance and understanding.  Screening Tests Health Maintenance  Topic Date Due   DEXA SCAN  Never done   Pneumonia Vaccine 44+ Years old (2 of 2 - PPSV23 or PCV20) 11/05/2020   Zoster Vaccines- Shingrix (2 of 2) 06/22/2021   DTaP/Tdap/Td (3 - Td or Tdap) 08/16/2021   COVID-19 Vaccine (5 -  2023-24 season) 08/24/2022   INFLUENZA VACCINE  07/25/2023   Medicare Annual Wellness (AWV)  08/18/2024    MAMMOGRAM  10/15/2024   Colonoscopy  11/15/2026   Hepatitis C Screening  Completed   HPV VACCINES  Aged Out    Health Maintenance  Health Maintenance Due  Topic Date Due   DEXA SCAN  Never done   Pneumonia Vaccine 42+ Years old (2 of 2 - PPSV23 or PCV20) 11/05/2020   Zoster Vaccines- Shingrix (2 of 2) 06/22/2021   DTaP/Tdap/Td (3 - Td or Tdap) 08/16/2021   COVID-19 Vaccine (5 - 2023-24 season) 08/24/2022   INFLUENZA VACCINE  07/25/2023    Colorectal cancer screening: Type of screening: Colonoscopy. Completed 11/15/21. Repeat every 5 years  Mammogram status: Completed 10/15/22. Repeat every year (Ordered today)  Bone Density status: Ordered today. Pt provided with contact info and advised to call to schedule appt.  Lung Cancer Screening: (Low Dose CT Chest recommended if Age 74-80 years, 20 pack-year currently smoking OR have quit w/in 15years.) does not qualify.   Lung Cancer Screening Referral: n/a  Additional Screening:  Hepatitis C Screening: does qualify; Completed 07/10/17  Vision Screening: Recommended annual ophthalmology exams for early detection of glaucoma and other disorders of the eye. Is the patient up to date with their annual eye exam?  No  Who is the provider or what is the name of the office in which the patient attends annual eye exams? none If pt is not established with a provider, would they like to be referred to a provider to establish care? No .   Dental Screening: Recommended annual dental exams for proper oral hygiene  Community Resource Referral / Chronic Care Management: CRR required this visit?  No   CCM required this visit?  No     Plan:     I have personally reviewed and noted the following in the patient's chart:   Medical and social history Use of alcohol, tobacco or illicit drugs  Current medications and supplements including opioid prescriptions. Patient is not currently taking opioid prescriptions. Functional ability and  status Nutritional status Physical activity Advanced directives List of other physicians Hospitalizations, surgeries, and ER visits in previous 12 months Vitals Screenings to include cognitive, depression, and falls Referrals and appointments  In addition, I have reviewed and discussed with patient certain preventive protocols, quality metrics, and best practice recommendations. A written personalized care plan for preventive services as well as general preventive health recommendations were provided to patient.     Kandis Fantasia [place], [place]   [id number]/82/9562   After Visit Summary: (Mail) Due to this being a telephonic visit, the after visit summary with patients personalized plan was offered to patient via mail   Nurse Notes: Patient scheduled for office visit for bump/boil under left axilla.

## 2023-08-19 ENCOUNTER — Ambulatory Visit (INDEPENDENT_AMBULATORY_CARE_PROVIDER_SITE_OTHER): Payer: Medicare HMO

## 2023-08-19 VITALS — Ht 62.0 in | Wt 175.0 lb

## 2023-08-19 DIAGNOSIS — Z0000 Encounter for general adult medical examination without abnormal findings: Secondary | ICD-10-CM | POA: Diagnosis not present

## 2023-08-19 DIAGNOSIS — Z1231 Encounter for screening mammogram for malignant neoplasm of breast: Secondary | ICD-10-CM

## 2023-08-19 DIAGNOSIS — Z780 Asymptomatic menopausal state: Secondary | ICD-10-CM

## 2023-09-02 ENCOUNTER — Ambulatory Visit (INDEPENDENT_AMBULATORY_CARE_PROVIDER_SITE_OTHER): Payer: Medicare HMO | Admitting: Family Medicine

## 2023-09-02 ENCOUNTER — Encounter: Payer: Self-pay | Admitting: Family Medicine

## 2023-09-02 VITALS — BP 160/80 | HR 57 | Ht 62.0 in | Wt 178.0 lb

## 2023-09-02 DIAGNOSIS — Z23 Encounter for immunization: Secondary | ICD-10-CM | POA: Diagnosis not present

## 2023-09-02 DIAGNOSIS — L0292 Furuncle, unspecified: Secondary | ICD-10-CM | POA: Diagnosis not present

## 2023-09-02 DIAGNOSIS — E782 Mixed hyperlipidemia: Secondary | ICD-10-CM | POA: Diagnosis not present

## 2023-09-02 DIAGNOSIS — I10 Essential (primary) hypertension: Secondary | ICD-10-CM

## 2023-09-02 MED ORDER — SPIRONOLACTONE 25 MG PO TABS: 25.0000 mg | ORAL_TABLET | Freq: Every day | ORAL | 3 refills | Status: DC

## 2023-09-02 NOTE — Assessment & Plan Note (Signed)
Elevated today and on recheck. Instructed **

## 2023-09-02 NOTE — Patient Instructions (Signed)
It was great to see you!  Our plans for today:  - Come back in 2 months for blood pressure recheck.  - If your boil come back, you can use warm compresses under your arm.   We are checking some labs today, we will release these results to your MyChart.  Take care and seek immediate care sooner if you develop any concerns.   Dr. Linwood Dibbles

## 2023-09-02 NOTE — Progress Notes (Unsigned)
   SUBJECTIVE:   CHIEF COMPLAINT / HPI:   Boil under arm - noticed few days ago but now improving. Under L arm. Denies any drainage, pain. No lumps in breast. H/o recurrent boils under b/l arms and in groin. Not on medication for this but occasionally will put fatback on them to help drain.   Hypertension: - Medications: spironolactone, hydralazine - Compliance: not taking hydralazine, thought this is what gave her AKI. Did not take meds today.  - Checking BP at home: yes, 140-150s SBP.   OBJECTIVE:   BP (!) 160/80   Pulse (!) 57   Ht 5\' 2"  (1.575 m)   Wt 178 lb (80.7 kg)   SpO2 100%   BMI 32.56 kg/m   Gen: well appearing, in NAD Card: RRR Lungs: CTAB Ext: WWP, no edema Skin: well healed boil in L armpit, few mm in size. No fluctuance or active drainage. No underarm lymphadenopathy bilaterally.    ASSESSMENT/PLAN:   No problem-specific Assessment & Plan notes found for this encounter.     Caro Laroche, DO

## 2023-09-03 ENCOUNTER — Encounter: Payer: Self-pay | Admitting: Family Medicine

## 2023-09-03 DIAGNOSIS — N183 Chronic kidney disease, stage 3 unspecified: Secondary | ICD-10-CM | POA: Insufficient documentation

## 2023-09-03 DIAGNOSIS — L0292 Furuncle, unspecified: Secondary | ICD-10-CM | POA: Insufficient documentation

## 2023-09-03 LAB — BASIC METABOLIC PANEL
BUN/Creatinine Ratio: 21 (ref 12–28)
BUN: 25 mg/dL (ref 8–27)
CO2: 22 mmol/L (ref 20–29)
Calcium: 9.8 mg/dL (ref 8.7–10.3)
Chloride: 106 mmol/L (ref 96–106)
Creatinine, Ser: 1.21 mg/dL — ABNORMAL HIGH (ref 0.57–1.00)
Glucose: 101 mg/dL — ABNORMAL HIGH (ref 70–99)
Potassium: 4.4 mmol/L (ref 3.5–5.2)
Sodium: 142 mmol/L (ref 134–144)
eGFR: 48 mL/min/{1.73_m2} — ABNORMAL LOW (ref >59)

## 2023-09-03 NOTE — Assessment & Plan Note (Signed)
Well healed. Given history, may have HS. Counseled on supportive measures.

## 2023-10-23 ENCOUNTER — Ambulatory Visit
Admission: RE | Admit: 2023-10-23 | Discharge: 2023-10-23 | Disposition: A | Discharge status: Home / Self Care | Payer: Medicare HMO | Source: Ambulatory Visit | Attending: Family Medicine

## 2023-10-23 DIAGNOSIS — Z1231 Encounter for screening mammogram for malignant neoplasm of breast: Secondary | ICD-10-CM

## 2023-10-28 ENCOUNTER — Ambulatory Visit: Payer: Medicare HMO | Admitting: Family Medicine

## 2023-11-12 ENCOUNTER — Other Ambulatory Visit: Payer: Self-pay | Admitting: Internal Medicine

## 2024-02-04 ENCOUNTER — Other Ambulatory Visit: Payer: Self-pay | Admitting: Internal Medicine

## 2024-02-04 MED ORDER — PRAVASTATIN SODIUM 40 MG PO TABS: 40.0000 mg | ORAL_TABLET | Freq: Every day | ORAL | 0 refills | Status: DC

## 2024-02-04 NOTE — Addendum Note (Signed)
Addended by: Adriana Simas, Yariana Hoaglund L on: 02/04/2024 04:15 PM   Modules accepted: Orders

## 2024-04-27 ENCOUNTER — Other Ambulatory Visit: Payer: Medicare HMO

## 2024-09-07 ENCOUNTER — Other Ambulatory Visit: Payer: Self-pay | Admitting: Family Medicine

## 2024-09-07 DIAGNOSIS — I10 Essential (primary) hypertension: Secondary | ICD-10-CM

## 2024-10-01 NOTE — Progress Notes (Signed)
 [name]                                          MRN: [id number]   10/01/2024   The [facility] Quality Team Specialist reviewed this patient medical record for the purposes of chart review for care gap closure. The following were reviewed: chart review for care gap closure-controlling blood pressure.    [facility] Quality Team

## 2024-10-09 NOTE — Progress Notes (Unsigned)
   SUBJECTIVE:   CHIEF COMPLAINT / HPI:    Hypertension: - Medications: spironolactone . Not taking hydralazine , didn't tolerate. - Compliance: good  - Checking BP at home: yes, 140s SBP  HLD - medications: pravastatin  - compliance: good - medication SEs: none   OBJECTIVE:   BP (!) 160/70   Pulse 63   Ht 5' 1 (1.549 m)   Wt 177 lb 9.6 oz (80.6 kg)   SpO2 98%   BMI 33.56 kg/m   Gen: well appearing, in NAD Card: RRR Lungs: CTAB Ext: WWP, no edema  ASSESSMENT/PLAN:   Essential hypertension Elevated today, improved on recheck, though still elevated, has not yet taken meds today. With home readings not far from goal, no changes today. F/u 1 month with home log, if needing additional control, consider addition of amlodipine . Prior AKI with ACEI. BMP today.   CKD (chronic kidney disease) stage 3, GFR 30-59 ml/min (HCC) Recheck BMP. Per chart review had AKI with ACEI.   Mixed hyperlipidemia Reports toleration/compliance of statin though last fill was 01/2024 for 15 tabs. Obtain labs today, refill sent.      Donald CHRISTELLA Lai, DO

## 2024-10-12 ENCOUNTER — Encounter: Payer: Self-pay | Admitting: Family Medicine

## 2024-10-12 ENCOUNTER — Ambulatory Visit (INDEPENDENT_AMBULATORY_CARE_PROVIDER_SITE_OTHER): Admitting: Family Medicine

## 2024-10-12 VITALS — BP 160/70 | HR 63 | Ht 61.0 in | Wt 177.6 lb

## 2024-10-12 DIAGNOSIS — N183 Chronic kidney disease, stage 3 unspecified: Secondary | ICD-10-CM | POA: Diagnosis not present

## 2024-10-12 DIAGNOSIS — I10 Essential (primary) hypertension: Secondary | ICD-10-CM | POA: Diagnosis not present

## 2024-10-12 DIAGNOSIS — E782 Mixed hyperlipidemia: Secondary | ICD-10-CM | POA: Diagnosis not present

## 2024-10-12 NOTE — Patient Instructions (Signed)
 It was great to see you!  Our plans for today:  - Monitor your blood pressure at home and keep a log of your readings. Make sure to be seated for at least 5 minutes prior to testing and not in pain or worked up for the most accurate readings. Bring this log with you to follow up.  - We are checking some labs today, we will release these results to your MyChart. - Come back in 1 month.  Take care and seek immediate care sooner if you develop any concerns.   Dr. Eleni Frank

## 2024-10-13 ENCOUNTER — Ambulatory Visit: Payer: Self-pay | Admitting: Family Medicine

## 2024-10-13 LAB — BASIC METABOLIC PANEL WITH GFR
BUN/Creatinine Ratio: 22 (ref 12–28)
BUN: 25 mg/dL (ref 8–27)
CO2: 22 mmol/L (ref 20–29)
Calcium: 9.8 mg/dL (ref 8.7–10.3)
Chloride: 104 mmol/L (ref 96–106)
Creatinine, Ser: 1.14 mg/dL — ABNORMAL HIGH (ref 0.57–1.00)
Glucose: 98 mg/dL (ref 70–99)
Potassium: 4.6 mmol/L (ref 3.5–5.2)
Sodium: 138 mmol/L (ref 134–144)
eGFR: 51 mL/min/1.73 — ABNORMAL LOW (ref >59)

## 2024-10-13 LAB — LIPID PANEL
Chol/HDL Ratio: 3.8 ratio (ref 0.0–4.4)
Cholesterol, Total: 178 mg/dL (ref 100–199)
HDL: 47 mg/dL (ref >39)
LDL Chol Calc (NIH): 118 mg/dL — ABNORMAL HIGH (ref 0–99)
Triglycerides: 66 mg/dL (ref 0–149)
VLDL Cholesterol Cal: 13 mg/dL (ref 5–40)

## 2024-10-13 MED ORDER — PRAVASTATIN SODIUM 40 MG PO TABS: 40.0000 mg | ORAL_TABLET | Freq: Every day | ORAL | 3 refills | Status: AC

## 2024-10-13 NOTE — Assessment & Plan Note (Signed)
 Elevated today, improved on recheck, though still elevated, has not yet taken meds today. With home readings not far from goal, no changes today. F/u 1 month with home log, if needing additional control, consider addition of amlodipine . Prior AKI with ACEI. BMP today.

## 2024-10-13 NOTE — Assessment & Plan Note (Addendum)
 Recheck BMP. Per chart review had AKI with ACEI.

## 2024-10-13 NOTE — Assessment & Plan Note (Addendum)
 Reports toleration/compliance of statin though last fill was 01/2024 for 15 tabs. Obtain labs today, refill sent.

## 2024-10-17 ENCOUNTER — Other Ambulatory Visit: Payer: Self-pay

## 2024-10-17 ENCOUNTER — Emergency Department (HOSPITAL_COMMUNITY)

## 2024-10-17 ENCOUNTER — Emergency Department (HOSPITAL_COMMUNITY)
Admission: EM | Admit: 2024-10-17 | Discharge: 2024-10-17 | Disposition: A | Discharge status: Home / Self Care | Attending: Emergency Medicine | Admitting: Emergency Medicine

## 2024-10-17 ENCOUNTER — Encounter (HOSPITAL_COMMUNITY): Payer: Self-pay | Admitting: Pharmacy Technician

## 2024-10-17 DIAGNOSIS — N189 Chronic kidney disease, unspecified: Secondary | ICD-10-CM | POA: Diagnosis not present

## 2024-10-17 DIAGNOSIS — W19XXXA Unspecified fall, initial encounter: Secondary | ICD-10-CM | POA: Insufficient documentation

## 2024-10-17 DIAGNOSIS — M25551 Pain in right hip: Secondary | ICD-10-CM | POA: Diagnosis not present

## 2024-10-17 DIAGNOSIS — Z79899 Other long term (current) drug therapy: Secondary | ICD-10-CM | POA: Diagnosis not present

## 2024-10-17 DIAGNOSIS — I129 Hypertensive chronic kidney disease with stage 1 through stage 4 chronic kidney disease, or unspecified chronic kidney disease: Secondary | ICD-10-CM | POA: Diagnosis not present

## 2024-10-17 DIAGNOSIS — Z7982 Long term (current) use of aspirin: Secondary | ICD-10-CM | POA: Diagnosis not present

## 2024-10-17 MED ORDER — ACETAMINOPHEN 325 MG PO TABS
650.0000 mg | ORAL_TABLET | Freq: Once | ORAL | Status: AC
  Administered 2024-10-17: 650 mg via ORAL
  Filled 2024-10-17: qty 2

## 2024-10-17 NOTE — Discharge Instructions (Addendum)
 Please use Tylenol  or ibuprofen for pain.  You may use 600 mg ibuprofen every 6 hours or 1000 mg of Tylenol  every 6 hours.  You may choose to alternate between the 2.  This would be most effective.  Not to exceed 4 g of Tylenol  within 24 hours.  Not to exceed 3200 mg ibuprofen 24 hours.  You can contact the orthopedic physician's contact information provided above as needed if you are having significant pain in 1-2 weeks.

## 2024-10-17 NOTE — ED Triage Notes (Signed)
 Pt here POV with reports of mechanical fall resulting in a stinging pain from R hip down the back of R leg.

## 2024-10-17 NOTE — ED Provider Notes (Signed)
 [place] EMERGENCY DEPARTMENT AT [facility] Provider Note   CSN: [id number] Arrival date & time: 10/17/24  1123     Patient presents with: No chief complaint on file.   [name] is a 73 y.o. female with past medical history significant for hypertension, CKD who presents concern for mechanical fall on the bus this morning.  She endorses some tingling pain on her right hip.  She has been able to ambulate but she reports it is painful.  Nothing for pain prior to arrival.  She denies any numbness, tingling, loss of control of bladder, bowels.  No previous history of fracture, hip replacement.  No history of osteoporosis.  She does not take any blood thinners.  She endorses some very mild pain on the right shoulder as well but with normal range of motion, she does not feel that anything is injured.   HPI     Prior to Admission medications   Medication Sig Start Date End Date Taking? Authorizing Provider  aspirin  81 MG EC tablet TAKE 1 TABLET (81 MG TOTAL) BY MOUTH DAILY.    Scarlet Elsie LABOR, MD  EPINEPHrine  0.3 mg/0.3 mL IJ SOAJ injection Inject 0.3 mg into the muscle as needed for anaphylaxis. 06/15/21   Jarold Olam HERO, PA-C  ondansetron  (ZOFRAN -ODT) 4 MG disintegrating tablet Take 1 tablet (4 mg total) by mouth every 8 (eight) hours as needed for nausea or vomiting. 07/22/22   Henderly, Britni A, PA-C  pravastatin  (PRAVACHOL ) 40 MG tablet Take 1 tablet (40 mg total) by mouth daily. 10/13/24   Rumball, Alison M, DO  spironolactone  (ALDACTONE ) 25 MG tablet TAKE 1 TABLET (25 MG TOTAL) BY MOUTH DAILY. 09/08/24   Rumball, Alison M, DO    Allergies: Ace inhibitors, Amlodipine , and Lipitor [atorvastatin ]    Review of Systems  All other systems reviewed and are negative.   Updated Vital Signs BP (!) 168/92   Pulse 69   Temp 97.7 F (36.5 C)   Resp 14   SpO2 93%   Physical Exam Vitals and nursing note reviewed.  Constitutional:      General: She is not in acute  distress.    Appearance: Normal appearance.  HENT:     Head: Normocephalic and atraumatic.  Eyes:     General:        Right eye: No discharge.        Left eye: No discharge.  Cardiovascular:     Rate and Rhythm: Normal rate and regular rhythm.     Pulses: Normal pulses.     Heart sounds: No murmur heard.    No friction rub. No gallop.  Pulmonary:     Effort: Pulmonary effort is normal.     Breath sounds: Normal breath sounds.  Abdominal:     General: Bowel sounds are normal.     Palpations: Abdomen is soft.  Musculoskeletal:     Comments: Tenderness to palpation right hip, right lumbar paraspinous muscles, no significant midline lumbar spinal tenderness.  Intact strength 5/5 of bilateral lower extremities.  Patient can ambulate without difficulty although does have some pain with ambulation.  Skin:    General: Skin is warm and dry.     Capillary Refill: Capillary refill takes less than 2 seconds.  Neurological:     Mental Status: She is alert and oriented to person, place, and time.  Psychiatric:        Mood and Affect: Mood normal.        Behavior:  Behavior normal.     (all labs ordered are listed, but only abnormal results are displayed) Labs Reviewed - No data to display  EKG: None  Radiology: DG Hip Unilat W or Wo Pelvis 2-3 Views Right Result Date: 10/17/2024 EXAM: 2 OR MORE VIEW(S) XRAY OF THE UNILATERAL HIP 10/17/2024 12:27:00 PM COMPARISON: CT abdomen and pelvis 07/25/2022. CLINICAL HISTORY: fall, right hip pain. Reason for exam: fall, right hip pain; Triage notes: Pt here POV with reports of mechanical fall resulting in a stinging pain from R hip down the back of R leg. FINDINGS: BONES AND JOINTS: No acute fracture or focal osseous lesion. Mild degenerative changes of the hip joints characterized by mild joint space narrowing and osteophytosis of the superior acetabulum. SOFT TISSUES: Multiple surgical clips noted in the pelvis. IMPRESSION: 1. No acute fracture or  dislocation. Electronically signed by: Helayne Hurst MD 10/17/2024 12:48 PM EDT RP Workstation: [id number]     Procedures   Medications Ordered in the ED  acetaminophen  (TYLENOL ) tablet 650 mg (has no administration in time range)                                    Medical Decision Making Amount and/or Complexity of Data Reviewed Radiology: ordered.   This patient is a 73 y.o. female who presents to the ED for concern of right hip pain, fall.   Differential diagnoses prior to evaluation: Fracture, dislocation, bruise, vs other  Past Medical History / Social History / Additional history: Chart reviewed. Pertinent results include: Hypertension, CKD, hyperlipidemia  Physical Exam: Physical exam performed. The pertinent findings include: Some tenderness to palpation of the right hip with no step-off, deformity, no leg length discrepancy, neurovascularly intact throughout.  Moderate hypertension, blood pressure 168/92, suspect secondary to pain.  I independently interpreted imaging including plain film radiograph of the right hip which shows no evidence of acute fracture, dislocation, or other abnormality in the right hip.. I agree with the radiologist interpretation.  Medications / Treatment: Tylenol  for pain   Disposition: After consideration of the diagnostic results and the patients response to treatment, I feel that ibuprofen, Tylenol , rest, orthopedic follow-up as needed.   emergency department workup does not suggest an emergent condition requiring admission or immediate intervention beyond what has been performed at this time. The plan is: as above. The patient is safe for discharge and has been instructed to return immediately for worsening symptoms, change in symptoms or any other concerns.   Final diagnoses:  Fall, initial encounter  Right hip pain    ED Discharge Orders     None          Rosan Sherlean VEAR DEVONNA 10/17/24 1319    Francesca Elsie CROME,  MD 10/18/24 [phone]

## 2024-11-10 NOTE — Progress Notes (Deleted)
    SUBJECTIVE:   CHIEF COMPLAINT / HPI:   Discussed the use of AI scribe software for clinical note transcription with the patient, who gave verbal consent to proceed.  History of Present Illness    Med compliance? START ARB if BP high on recheck. Lip swelling with amlodipine ? Could try hydrochlorothiazide ?  Taking statin? (Myopathy?) Sent new rx after elevated labs.   OBJECTIVE:   There were no vitals taken for this visit.  Gen: well appearing, in NAD Card: RRR Lungs: CTAB Ext: WWP, no edema ***  ASSESSMENT/PLAN:   No problem-specific Assessment & Plan notes found for this encounter.     Assessment and Plan Assessment & Plan         Laurie CHRISTELLA Lai, DO

## 2024-11-11 ENCOUNTER — Ambulatory Visit: Admitting: Family Medicine

## 2024-12-12 ENCOUNTER — Other Ambulatory Visit: Payer: Self-pay | Admitting: Family Medicine

## 2024-12-12 DIAGNOSIS — I10 Essential (primary) hypertension: Secondary | ICD-10-CM

## 2025-02-01 ENCOUNTER — Encounter
